# Patient Record
Sex: Female | Born: 1985 | Race: White | Hispanic: No | Marital: Married | Smoking: Current every day smoker
Health system: Southern US, Community
[De-identification: ages and names within clinical notes are randomized; demographics above are authoritative.]

## PROBLEM LIST (undated history)

## (undated) DIAGNOSIS — A749 Chlamydial infection, unspecified: Secondary | ICD-10-CM

## (undated) DIAGNOSIS — Q796 Ehlers-Danlos syndrome, unspecified: Secondary | ICD-10-CM

## (undated) DIAGNOSIS — R87612 Low grade squamous intraepithelial lesion on cytologic smear of cervix (LGSIL): Secondary | ICD-10-CM

## (undated) DIAGNOSIS — I7090 Unspecified atherosclerosis: Secondary | ICD-10-CM

## (undated) DIAGNOSIS — O039 Complete or unspecified spontaneous abortion without complication: Secondary | ICD-10-CM

## (undated) DIAGNOSIS — F419 Anxiety disorder, unspecified: Secondary | ICD-10-CM

## (undated) DIAGNOSIS — Z6791 Unspecified blood type, Rh negative: Secondary | ICD-10-CM

## (undated) DIAGNOSIS — O99119 Other diseases of the blood and blood-forming organs and certain disorders involving the immune mechanism complicating pregnancy, unspecified trimester: Secondary | ICD-10-CM

## (undated) DIAGNOSIS — T8859XA Other complications of anesthesia, initial encounter: Secondary | ICD-10-CM

## (undated) DIAGNOSIS — I639 Cerebral infarction, unspecified: Secondary | ICD-10-CM

## (undated) DIAGNOSIS — Z8673 Personal history of transient ischemic attack (TIA), and cerebral infarction without residual deficits: Secondary | ICD-10-CM

## (undated) DIAGNOSIS — O26899 Other specified pregnancy related conditions, unspecified trimester: Secondary | ICD-10-CM

## (undated) DIAGNOSIS — D6859 Other primary thrombophilia: Secondary | ICD-10-CM

## (undated) DIAGNOSIS — T4145XA Adverse effect of unspecified anesthetic, initial encounter: Secondary | ICD-10-CM

## (undated) DIAGNOSIS — D682 Hereditary deficiency of other clotting factors: Secondary | ICD-10-CM

## (undated) HISTORY — DX: Other specified pregnancy related conditions, unspecified trimester: O26.899

## (undated) HISTORY — DX: Cerebral infarction, unspecified: I63.9

## (undated) HISTORY — PX: ANKLE SURGERY: SHX546

## (undated) HISTORY — DX: Ehlers-Danlos syndrome, unspecified: Q79.60

## (undated) HISTORY — PX: BREAST ENHANCEMENT SURGERY: SHX7

## (undated) HISTORY — DX: Other primary thrombophilia: D68.59

## (undated) HISTORY — DX: Other diseases of the blood and blood-forming organs and certain disorders involving the immune mechanism complicating pregnancy, unspecified trimester: O99.119

## (undated) HISTORY — DX: Low grade squamous intraepithelial lesion on cytologic smear of cervix (LGSIL): R87.612

## (undated) HISTORY — PX: TONSILLECTOMY: SUR1361

## (undated) HISTORY — DX: Personal history of transient ischemic attack (TIA), and cerebral infarction without residual deficits: Z86.73

## (undated) HISTORY — DX: Unspecified atherosclerosis: I70.90

## (undated) HISTORY — PX: DILATION AND CURETTAGE OF UTERUS: SHX78

## (undated) HISTORY — DX: Hereditary deficiency of other clotting factors: D68.2

## (undated) HISTORY — DX: Unspecified blood type, rh negative: Z67.91

## (undated) HISTORY — DX: Anxiety disorder, unspecified: F41.9

## (undated) HISTORY — PX: SHOULDER SURGERY: SHX246

## (undated) HISTORY — DX: Complete or unspecified spontaneous abortion without complication: O03.9

## (undated) HISTORY — DX: Chlamydial infection, unspecified: A74.9

---

## 2004-06-04 ENCOUNTER — Emergency Department: Payer: Self-pay | Admitting: Emergency Medicine

## 2005-03-08 ENCOUNTER — Emergency Department (HOSPITAL_COMMUNITY): Admission: EM | Admit: 2005-03-08 | Discharge: 2005-03-08 | Payer: Self-pay | Admitting: Emergency Medicine

## 2005-04-01 ENCOUNTER — Emergency Department (HOSPITAL_COMMUNITY): Admission: EM | Admit: 2005-04-01 | Discharge: 2005-04-01 | Payer: Self-pay | Admitting: Emergency Medicine

## 2005-04-29 ENCOUNTER — Emergency Department (HOSPITAL_COMMUNITY): Admission: EM | Admit: 2005-04-29 | Discharge: 2005-04-29 | Payer: Self-pay | Admitting: Emergency Medicine

## 2005-05-01 ENCOUNTER — Encounter (INDEPENDENT_AMBULATORY_CARE_PROVIDER_SITE_OTHER): Payer: Self-pay | Admitting: *Deleted

## 2005-05-01 ENCOUNTER — Ambulatory Visit (HOSPITAL_COMMUNITY): Admission: RE | Admit: 2005-05-01 | Discharge: 2005-05-01 | Payer: Self-pay | Admitting: Obstetrics and Gynecology

## 2006-05-14 ENCOUNTER — Emergency Department (HOSPITAL_COMMUNITY): Admission: EM | Admit: 2006-05-14 | Discharge: 2006-05-14 | Payer: Self-pay | Admitting: Emergency Medicine

## 2006-05-16 ENCOUNTER — Inpatient Hospital Stay (HOSPITAL_COMMUNITY): Admission: AD | Admit: 2006-05-16 | Discharge: 2006-05-16 | Payer: Self-pay | Admitting: Obstetrics and Gynecology

## 2007-07-07 DIAGNOSIS — Z8673 Personal history of transient ischemic attack (TIA), and cerebral infarction without residual deficits: Secondary | ICD-10-CM

## 2007-07-07 DIAGNOSIS — I639 Cerebral infarction, unspecified: Secondary | ICD-10-CM

## 2007-07-07 HISTORY — DX: Cerebral infarction, unspecified: I63.9

## 2007-07-07 HISTORY — DX: Personal history of transient ischemic attack (TIA), and cerebral infarction without residual deficits: Z86.73

## 2007-08-20 ENCOUNTER — Emergency Department (HOSPITAL_COMMUNITY): Admission: EM | Admit: 2007-08-20 | Discharge: 2007-08-20 | Payer: Self-pay | Admitting: Emergency Medicine

## 2007-10-09 ENCOUNTER — Inpatient Hospital Stay (HOSPITAL_COMMUNITY): Admission: AD | Admit: 2007-10-09 | Discharge: 2007-10-09 | Payer: Self-pay | Admitting: Obstetrics & Gynecology

## 2007-11-14 ENCOUNTER — Emergency Department: Payer: Self-pay | Admitting: Emergency Medicine

## 2007-11-24 IMAGING — US US OB TRANSVAGINAL MODIFY
1 series · 14 of 28 positions shown · non-contrast
Comparison: none

CLINICAL DATA: 19-year-old female with positive pregnancy test.  Pelvic pain and spotting.
OBSTETRICAL ULTRASOUND <14 WKS AND TRANSVAGINAL OB US:
TECHNIQUE: Both transabdominal and transvaginal ultrasound examinations were performed for complete evaluation of the gestation as well as the maternal uterus, adnexal regions, and pelvic cul-de-sac.

[Series 1: ob · 0.32mm/px · 14 of 39 slices shown]
[im 2/39]
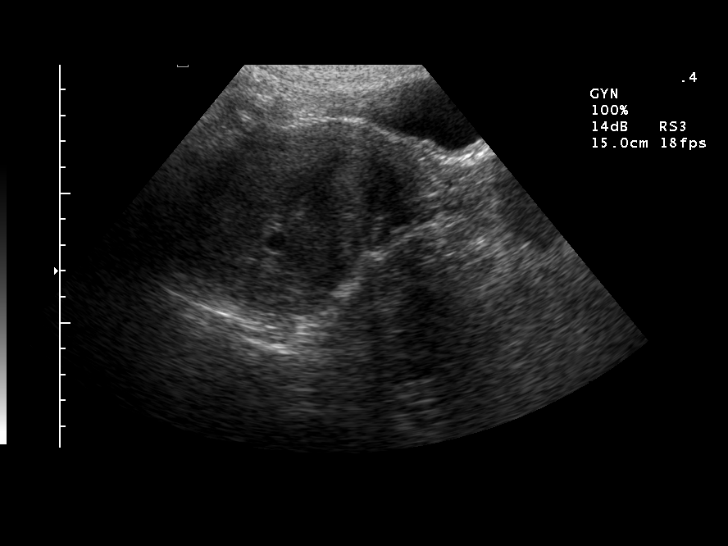
[im 5/39]
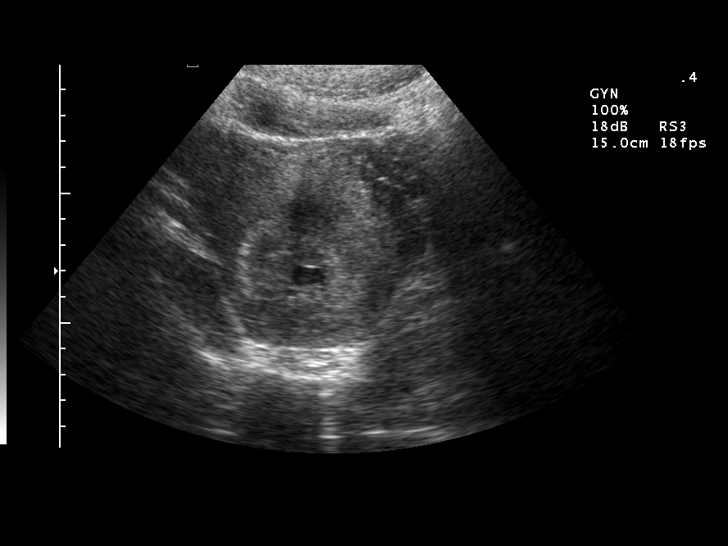
[im 8/39]
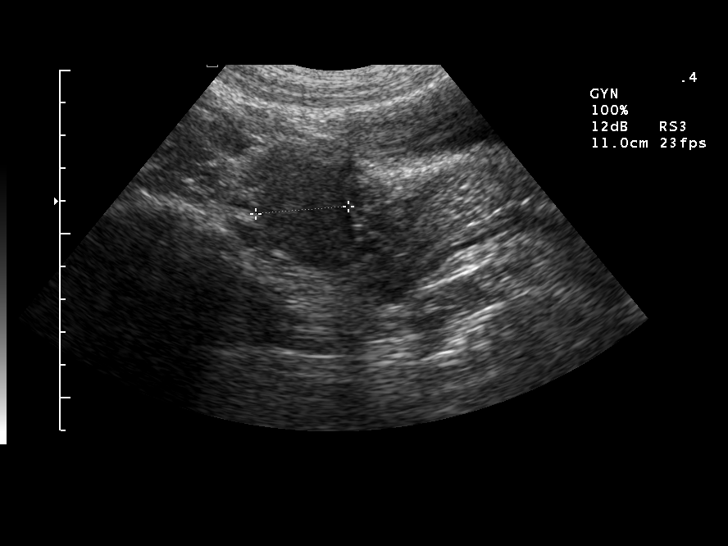
[im 10/39]
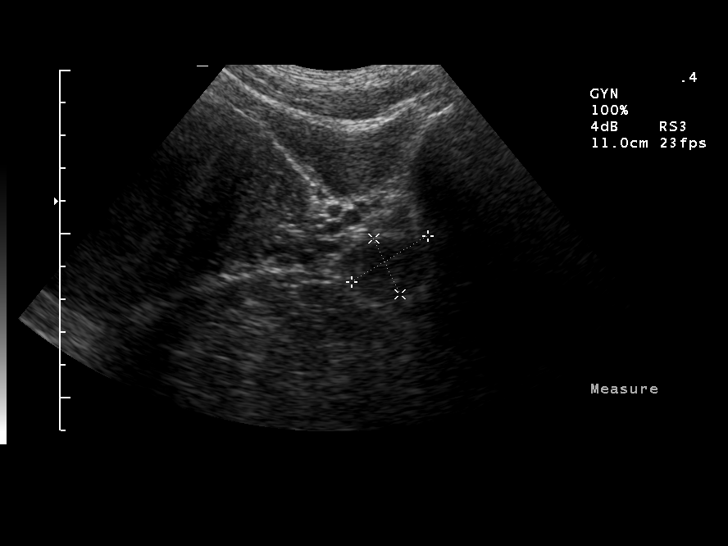
[im 13/39]
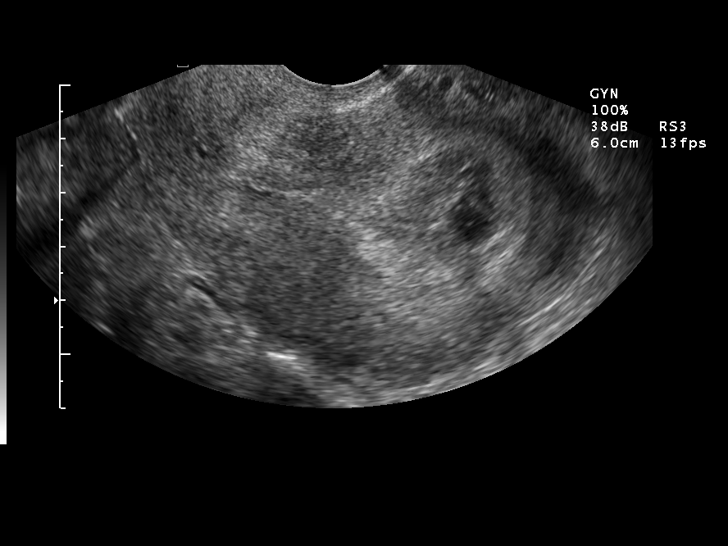
[im 16/39]
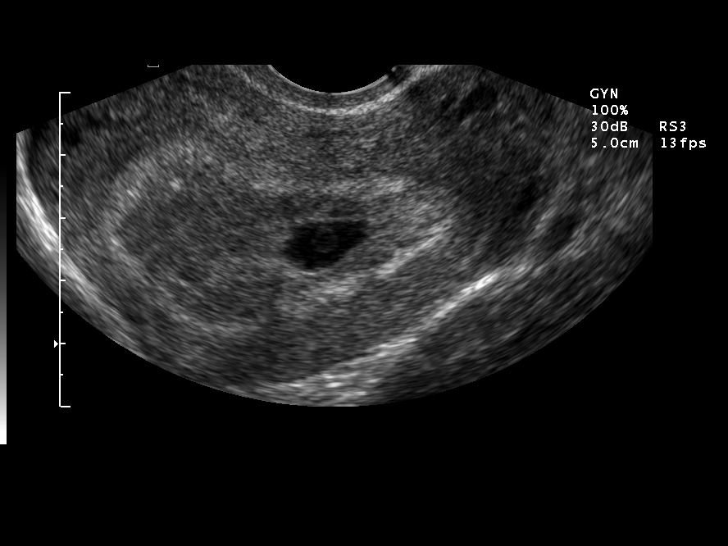
[im 19/39]
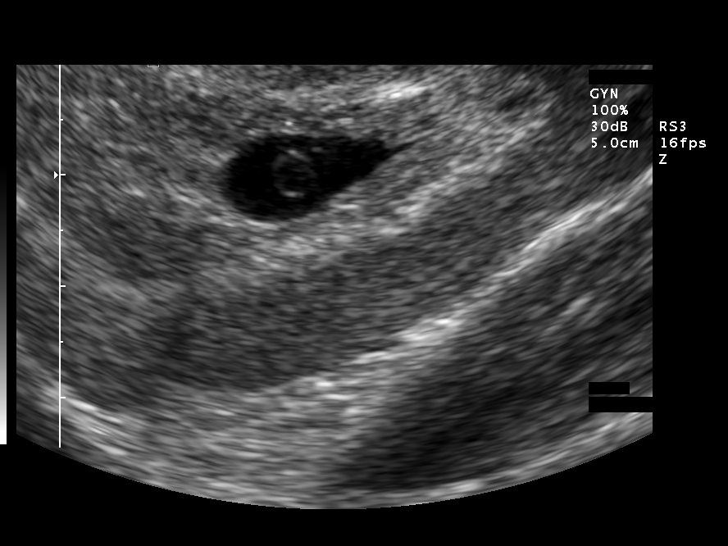
[im 22/39]
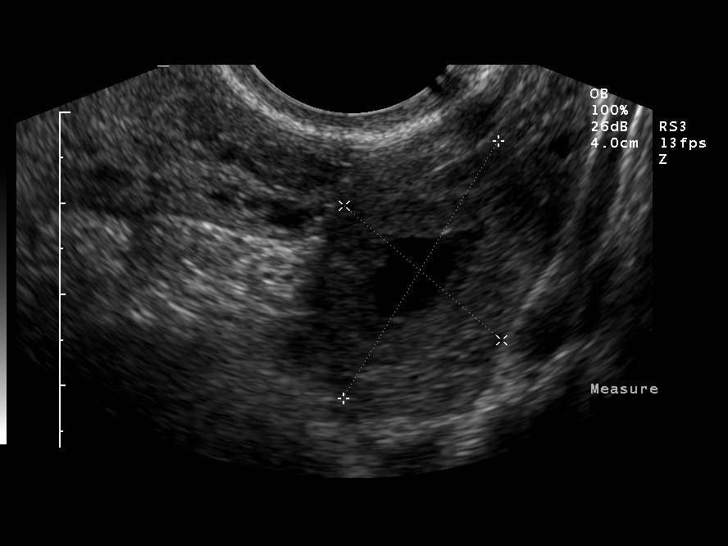
[im 24/39]
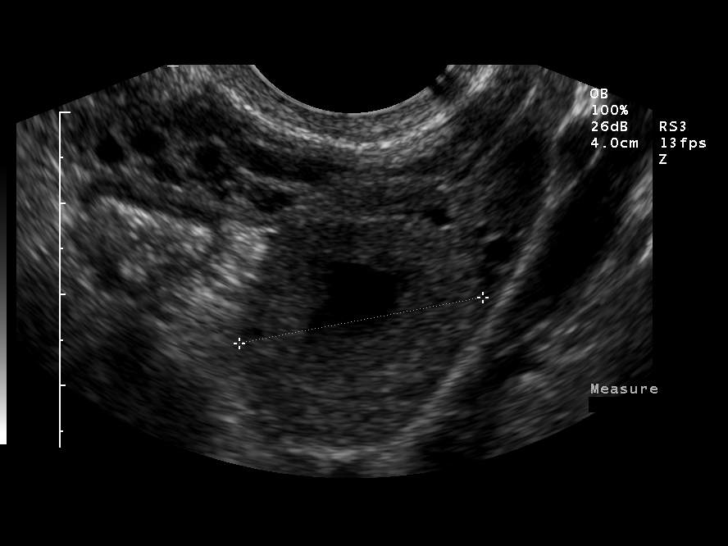
[im 27/39]
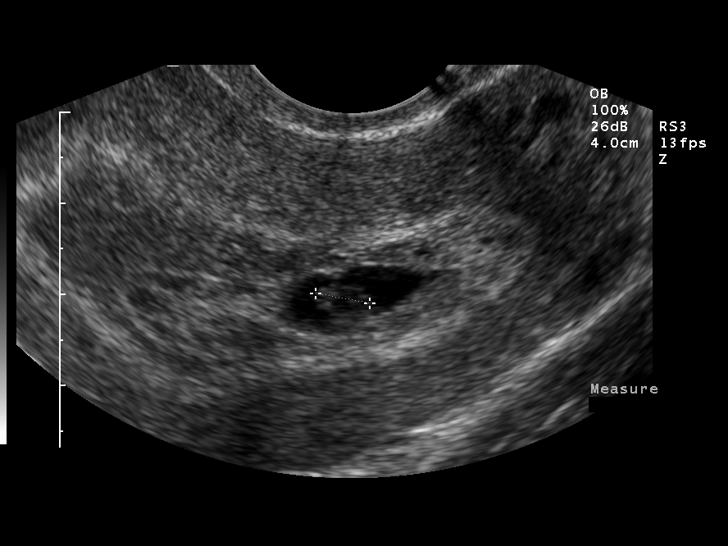
[im 30/39]
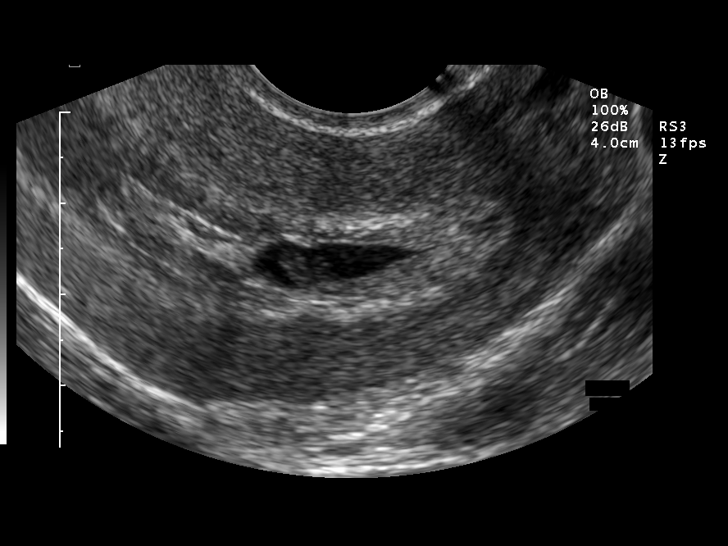
[im 33/39]
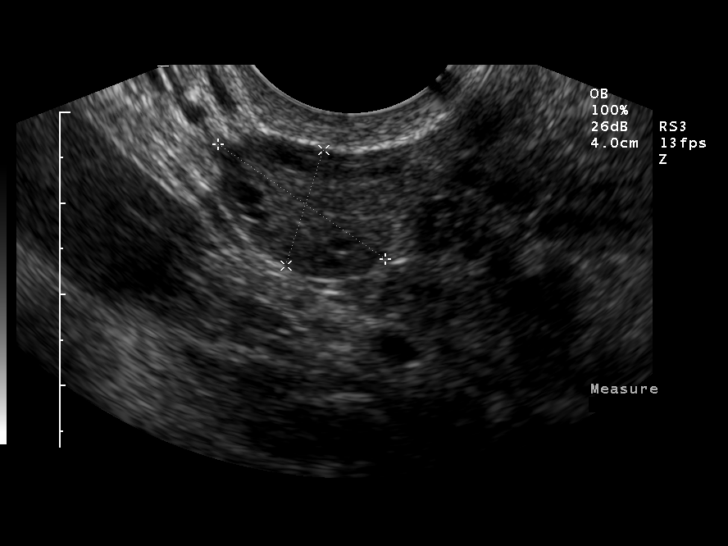
[im 36/39]
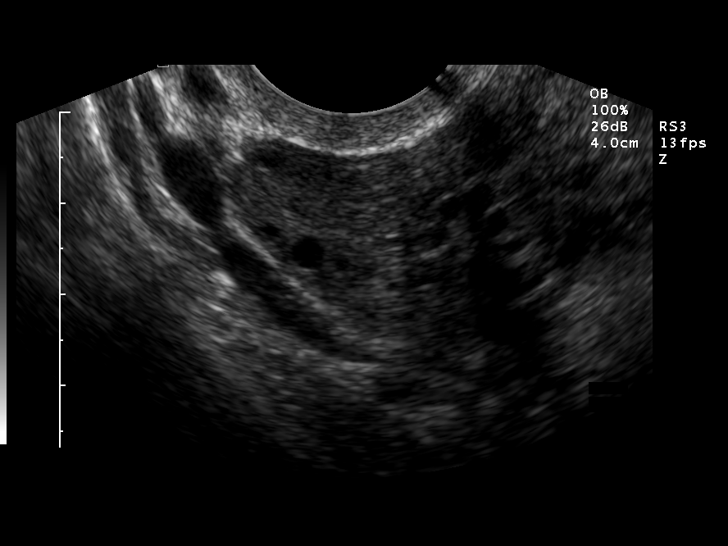
[im 39/39]
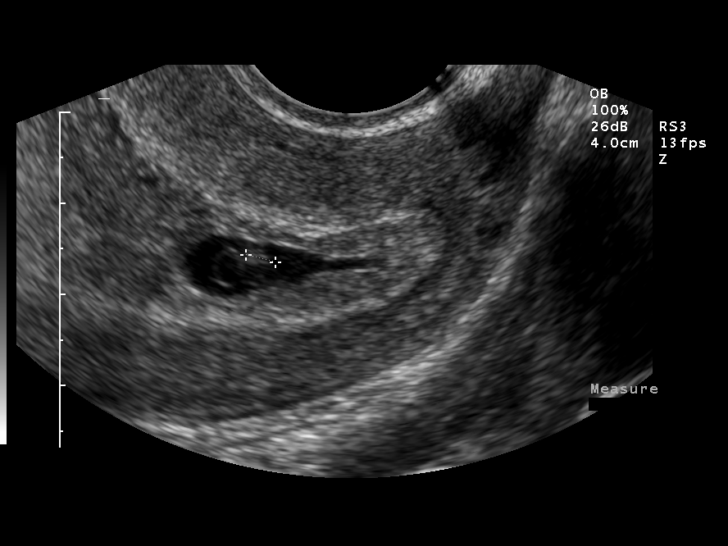

[14 of 28 positions shown; findings below may reference images not displayed]

FINDINGS: There is a single intrauterine gestational sac with yolk sac and fetal pole.  The gestational sac is slightly elongated.  Crown-rump length measures 3.3 mm, corresponding to a gestational age of 6 weeks 0 days.  Cardiac activity is not identified at this time.  A small subchorionic hemorrhage is noted.  Ovaries bilaterally are within normal limits.  No free fluid identified.
IMPRESSION: 1.  Single intrauterine gestation with estimated gestational age of 6 weeks 0 days.  Slightly irregular gestational sac and no fetal cardiac activity identified.  This still may represent a normal early pregnancy, but follow-up is recommended.
2.   Small subchorionic hemorrhage.

## 2008-08-04 ENCOUNTER — Ambulatory Visit: Payer: Self-pay | Admitting: Family Medicine

## 2008-08-04 DIAGNOSIS — I6350 Cerebral infarction due to unspecified occlusion or stenosis of unspecified cerebral artery: Secondary | ICD-10-CM | POA: Insufficient documentation

## 2008-08-04 DIAGNOSIS — D682 Hereditary deficiency of other clotting factors: Secondary | ICD-10-CM

## 2008-08-04 DIAGNOSIS — B019 Varicella without complication: Secondary | ICD-10-CM | POA: Insufficient documentation

## 2008-08-04 DIAGNOSIS — D6859 Other primary thrombophilia: Secondary | ICD-10-CM

## 2008-08-04 DIAGNOSIS — I808 Phlebitis and thrombophlebitis of other sites: Secondary | ICD-10-CM | POA: Insufficient documentation

## 2008-08-04 DIAGNOSIS — R519 Headache, unspecified: Secondary | ICD-10-CM | POA: Insufficient documentation

## 2008-08-04 DIAGNOSIS — R51 Headache: Secondary | ICD-10-CM

## 2008-08-26 ENCOUNTER — Encounter: Payer: Self-pay | Admitting: Family Medicine

## 2008-09-29 ENCOUNTER — Ambulatory Visit: Payer: Self-pay | Admitting: Family Medicine

## 2008-09-29 LAB — CONVERTED CEMR LAB
Basophils Absolute: 0 10*3/uL (ref 0.0–0.1)
Eosinophils Absolute: 0.2 10*3/uL (ref 0.0–0.7)
Hemoglobin: 14.2 g/dL (ref 12.0–15.0)
Monocytes Absolute: 0.7 10*3/uL (ref 0.1–1.0)
Monocytes Relative: 10 % (ref 3–12)
Neutrophils Relative %: 55 % (ref 43–77)
Platelets: 201 10*3/uL (ref 150–400)
RBC: 4.65 M/uL (ref 3.87–5.11)
RDW: 12.6 % (ref 11.5–15.5)
Rh Type: NEGATIVE
WBC: 6.5 10*3/uL (ref 4.0–10.5)
hCG, Beta Chain, Quant, S: 157.1 milliintl units/mL

## 2008-10-01 ENCOUNTER — Inpatient Hospital Stay (HOSPITAL_COMMUNITY): Admission: AD | Admit: 2008-10-01 | Discharge: 2008-10-01 | Payer: Self-pay | Admitting: Obstetrics & Gynecology

## 2008-10-03 ENCOUNTER — Ambulatory Visit (HOSPITAL_COMMUNITY): Admission: AD | Admit: 2008-10-03 | Discharge: 2008-10-03 | Payer: Self-pay | Admitting: Obstetrics & Gynecology

## 2008-10-04 ENCOUNTER — Ambulatory Visit: Payer: Self-pay | Admitting: Family Medicine

## 2008-10-12 ENCOUNTER — Ambulatory Visit: Payer: Self-pay | Admitting: Obstetrics & Gynecology

## 2008-10-12 LAB — CONVERTED CEMR LAB: hCG, Beta Chain, Quant, S: <2 milliintl units/mL

## 2008-10-19 ENCOUNTER — Ambulatory Visit: Payer: Self-pay | Admitting: Family Medicine

## 2008-10-19 ENCOUNTER — Encounter: Payer: Self-pay | Admitting: Family Medicine

## 2008-11-03 ENCOUNTER — Emergency Department: Payer: Self-pay | Admitting: Emergency Medicine

## 2008-11-18 ENCOUNTER — Encounter: Payer: Self-pay | Admitting: Obstetrics & Gynecology

## 2008-11-18 LAB — CONVERTED CEMR LAB: hCG, Beta Chain, Quant, S: 727.7 milliintl units/mL

## 2008-11-21 ENCOUNTER — Inpatient Hospital Stay (HOSPITAL_COMMUNITY): Admission: AD | Admit: 2008-11-21 | Discharge: 2008-11-21 | Payer: Self-pay | Admitting: Obstetrics and Gynecology

## 2008-11-22 ENCOUNTER — Ambulatory Visit: Payer: Self-pay | Admitting: Obstetrics and Gynecology

## 2008-11-22 LAB — CONVERTED CEMR LAB: hCG, Beta Chain, Quant, S: 282 milliintl units/mL

## 2008-11-23 ENCOUNTER — Ambulatory Visit: Payer: Self-pay | Admitting: Obstetrics & Gynecology

## 2008-12-15 ENCOUNTER — Encounter: Payer: Self-pay | Admitting: Family Medicine

## 2008-12-15 ENCOUNTER — Ambulatory Visit: Payer: Self-pay | Admitting: Obstetrics & Gynecology

## 2008-12-15 LAB — CONVERTED CEMR LAB: hCG, Beta Chain, Quant, S: <2 milliintl units/mL

## 2008-12-27 ENCOUNTER — Encounter: Payer: Self-pay | Admitting: Obstetrics and Gynecology

## 2008-12-27 LAB — CONVERTED CEMR LAB: hCG, Beta Chain, Quant, S: 69.2 milliintl units/mL

## 2009-01-03 ENCOUNTER — Encounter: Payer: Self-pay | Admitting: Family Medicine

## 2009-01-03 LAB — CONVERTED CEMR LAB: hCG, Beta Chain, Quant, S: 1846.9 milliintl units/mL

## 2009-01-05 ENCOUNTER — Encounter: Payer: Self-pay | Admitting: Obstetrics & Gynecology

## 2009-01-05 ENCOUNTER — Ambulatory Visit: Payer: Self-pay | Admitting: Obstetrics and Gynecology

## 2009-01-13 ENCOUNTER — Ambulatory Visit: Payer: Self-pay | Admitting: Family Medicine

## 2009-01-13 LAB — CONVERTED CEMR LAB
Basophils Absolute: 0 10*3/uL (ref 0.0–0.1)
Basophils Relative: 0 % (ref 0–1)
Chlamydia, Swab/Urine, PCR: NEGATIVE
GC Probe Amp, Urine: NEGATIVE
HCT: 41.5 % (ref 36.0–46.0)
Hemoglobin: 14.3 g/dL (ref 12.0–15.0)
Hepatitis B Surface Ag: NEGATIVE
MCHC: 34.5 g/dL (ref 30.0–36.0)
Platelets: 195 10*3/uL (ref 150–400)
RBC: 4.67 M/uL (ref 3.87–5.11)
RDW: 12.1 % (ref 11.5–15.5)

## 2009-01-17 ENCOUNTER — Ambulatory Visit: Payer: Self-pay | Admitting: Family Medicine

## 2009-01-17 LAB — CONVERTED CEMR LAB: hCG, Beta Chain, Quant, S: 30206 milliintl units/mL

## 2009-01-18 ENCOUNTER — Encounter: Payer: Self-pay | Admitting: Family Medicine

## 2009-01-18 LAB — CONVERTED CEMR LAB: hCG, Beta Chain, Quant, S: 31646.8 milliintl units/mL

## 2009-01-19 ENCOUNTER — Encounter: Payer: Self-pay | Admitting: Family Medicine

## 2009-01-20 ENCOUNTER — Ambulatory Visit (HOSPITAL_COMMUNITY): Admission: RE | Admit: 2009-01-20 | Discharge: 2009-01-20 | Payer: Self-pay | Admitting: Family Medicine

## 2009-01-24 ENCOUNTER — Ambulatory Visit: Payer: Self-pay | Admitting: Obstetrics and Gynecology

## 2009-01-24 ENCOUNTER — Encounter: Payer: Self-pay | Admitting: Obstetrics & Gynecology

## 2009-01-26 ENCOUNTER — Ambulatory Visit: Payer: Self-pay | Admitting: Obstetrics & Gynecology

## 2009-01-27 ENCOUNTER — Ambulatory Visit (HOSPITAL_COMMUNITY): Admission: RE | Admit: 2009-01-27 | Discharge: 2009-01-27 | Payer: Self-pay | Admitting: Obstetrics & Gynecology

## 2009-01-27 ENCOUNTER — Ambulatory Visit: Payer: Self-pay | Admitting: Obstetrics & Gynecology

## 2009-02-22 ENCOUNTER — Ambulatory Visit: Payer: Self-pay | Admitting: Obstetrics & Gynecology

## 2009-06-13 ENCOUNTER — Ambulatory Visit: Payer: Self-pay | Admitting: Obstetrics and Gynecology

## 2010-03-06 ENCOUNTER — Ambulatory Visit: Admit: 2010-03-06 | Payer: Self-pay | Admitting: Obstetrics and Gynecology

## 2010-03-07 ENCOUNTER — Encounter: Payer: Self-pay | Admitting: Family Medicine

## 2010-03-10 ENCOUNTER — Institutional Professional Consult (permissible substitution): Payer: Self-pay

## 2010-03-10 ENCOUNTER — Encounter: Payer: Self-pay | Admitting: Family Medicine

## 2010-03-10 DIAGNOSIS — Z3480 Encounter for supervision of other normal pregnancy, unspecified trimester: Secondary | ICD-10-CM

## 2010-03-10 LAB — CONVERTED CEMR LAB
Antibody Screen: NEGATIVE
Basophils Absolute: 0 10*3/uL (ref 0.0–0.1)
Eosinophils Absolute: 0.2 10*3/uL (ref 0.0–0.7)
Eosinophils Relative: 2 % (ref 0–5)
Hemoglobin: 15.2 g/dL — ABNORMAL HIGH (ref 12.0–15.0)
Hepatitis B Surface Ag: NEGATIVE
Lymphs Abs: 2.3 10*3/uL (ref 0.7–4.0)
MCV: 88.9 fL (ref 78.0–100.0)
Monocytes Absolute: 0.6 10*3/uL (ref 0.1–1.0)
Neutro Abs: 7 10*3/uL (ref 1.7–7.7)
Rubella: 36.4 intl units/mL — ABNORMAL HIGH
WBC: 10.2 10*3/uL (ref 4.0–10.5)

## 2010-03-15 ENCOUNTER — Encounter: Payer: Self-pay | Admitting: Obstetrics & Gynecology

## 2010-03-15 DIAGNOSIS — Z1272 Encounter for screening for malignant neoplasm of vagina: Secondary | ICD-10-CM

## 2010-03-15 DIAGNOSIS — Z3480 Encounter for supervision of other normal pregnancy, unspecified trimester: Secondary | ICD-10-CM

## 2010-03-22 DIAGNOSIS — Z3480 Encounter for supervision of other normal pregnancy, unspecified trimester: Secondary | ICD-10-CM

## 2010-03-24 ENCOUNTER — Other Ambulatory Visit: Payer: PRIVATE HEALTH INSURANCE

## 2010-03-29 ENCOUNTER — Encounter: Payer: PRIVATE HEALTH INSURANCE | Admitting: Obstetrics & Gynecology

## 2010-04-01 ENCOUNTER — Inpatient Hospital Stay (HOSPITAL_COMMUNITY): Payer: PRIVATE HEALTH INSURANCE

## 2010-04-01 ENCOUNTER — Inpatient Hospital Stay (HOSPITAL_COMMUNITY)
Admission: AD | Admit: 2010-04-01 | Discharge: 2010-04-01 | Disposition: A | Discharge status: Home / Self Care | Payer: PRIVATE HEALTH INSURANCE | Source: Ambulatory Visit | Attending: Obstetrics & Gynecology | Admitting: Obstetrics & Gynecology

## 2010-04-01 ENCOUNTER — Other Ambulatory Visit: Payer: Self-pay | Admitting: Obstetrics & Gynecology

## 2010-04-01 DIAGNOSIS — O039 Complete or unspecified spontaneous abortion without complication: Secondary | ICD-10-CM

## 2010-04-01 LAB — DIFFERENTIAL: Neutrophils Relative %: 74 % (ref 43–77)

## 2010-04-01 LAB — CBC
HCT: 42.4 % (ref 36.0–46.0)
MCH: 31 pg (ref 26.0–34.0)
RBC: 4.77 MIL/uL (ref 3.87–5.11)
WBC: 10.7 10*3/uL — ABNORMAL HIGH (ref 4.0–10.5)

## 2010-04-02 LAB — RHOGAM INJECTION

## 2010-04-06 DEATH — deceased

## 2010-04-11 ENCOUNTER — Ambulatory Visit: Payer: PRIVATE HEALTH INSURANCE | Admitting: Obstetrics and Gynecology

## 2010-04-11 DIAGNOSIS — N949 Unspecified condition associated with female genital organs and menstrual cycle: Secondary | ICD-10-CM

## 2010-04-28 NOTE — Assessment & Plan Note (Signed)
Regina Butler, Regina [name]:  [id number]  MEDICAL RECORD NO.:  [id number]           PATIENT TYPE:  LOCATION:  [facility] at [facility]           FACILITY:  PHYSICIAN:  Tinnie Gens, MD        DATE OF BIRTH:  04/13/1985  DATE OF SERVICE:                                 CLINIC NOTE  CHIEF COMPLAINT:  Follow-up SAB.  HISTORY OF PRESENT ILLNESS:  The patient is a 25 year old multigravid patient, unclear exactly how far how many miscarriages she has had.  She has had one full-term delivery, but seems like 10 or 11 intervening miscarriages.  She is just recently on April 01, 2010, miscarried her last pregnancy which has gotten to approximately 9-10 weeks and she was on Prometrium to see if that would help her stop miscarrying.  She has known protein S deficiency and questionably Ehlers-Danlos, although I do not think this is a certainly not a collagen deficiency problem at present.  The patient is very tearful today.  She has had multiple ultrasounds with a known fetus and good fetal heart rate and she passed the fetus in milieu and actually saw and is having trouble getting that out of her mind, having trouble sleeping, having panic attacks recurrently.  I have reviewed records today from her hematologist, Dr. Haynes Bast, who says she probably has protein S deficiency and he does not think she has factor V Leiden deficiency or protein C deficiency or antithrombin III.  He recommended her to resume her Lovenox from 4-5 days after her bleeding stopped.  She thinks she will resume this on Thursday.  Additionally, he had referred her to [facility] where she went and she had a very large workup with a lot of blood test for recurrent SABs including chromosome studies on both she and her husband. She is interested today in insurance coverage for Copper T IUD.  PHYSICAL EXAMINATION:  VITAL SIGNS:  Her vitals are shown  in chart. GENERAL:  A well-nourished female in no acute  distress. ABDOMEN:  Soft, nontender, nondistended. GU:  Normal external female genitalia.  BUS normal. Vagina is pink and rugated.  Cervix is closed.  The uterus is small and well involuted approximately 6 weeks' size, nontender.  IMPRESSION:  Recurrent abortion, completed.  PLAN: 1. We will try to get records from [facility].  Check insurance     coverage for Copper IUD, in the meantime, she will use condoms as     needed for birth control she should. 2. Resume her Lovenox in the next few days. 3. For her panic attacks and just until she gets grief counseling     which she has an appointment scheduled next week for.  We will give     her a prescription of Xanax 0.25 one to two p.o. t.i.d. as needed     for panic attacks.         ______________________________ Tinnie Gens, MD   TP/MEDQ  D:  04/11/2010  T:  04/12/2010  Job:  [id number]

## 2010-05-08 LAB — CBC
Hemoglobin: 15 g/dL (ref 12.0–15.0)
RBC: 4.77 MIL/uL (ref 3.87–5.11)
RDW: 12.1 % (ref 11.5–15.5)
WBC: 8.5 10*3/uL (ref 4.0–10.5)

## 2010-05-11 LAB — RH IMMUNE GLOBULIN WORKUP (NOT WOMEN'S HOSP)
ABO/RH(D): A NEG
DAT, IgG: NEGATIVE

## 2010-05-11 LAB — CBC
HCT: 37.9 % (ref 36.0–46.0)
Hemoglobin: 12.8 g/dL (ref 12.0–15.0)
MCHC: 33.9 g/dL (ref 30.0–36.0)
MCV: 93.3 fL (ref 78.0–100.0)
RBC: 4.07 MIL/uL (ref 3.87–5.11)

## 2010-05-13 LAB — CBC
HCT: 44.7 % (ref 36.0–46.0)
MCV: 93.1 fL (ref 78.0–100.0)
Platelets: 220 10*3/uL (ref 150–400)
RDW: 12.2 % (ref 11.5–15.5)

## 2010-05-13 LAB — WET PREP, GENITAL: Yeast Wet Prep HPF POC: NONE SEEN

## 2010-05-13 LAB — RH IMMUNE GLOBULIN WORKUP (NOT WOMEN'S HOSP): Antibody Screen: NEGATIVE

## 2010-05-13 LAB — POCT PREGNANCY, URINE: Preg Test, Ur: POSITIVE

## 2010-05-13 LAB — GC/CHLAMYDIA PROBE AMP, GENITAL
Chlamydia, DNA Probe: NEGATIVE
GC Probe Amp, Genital: NEGATIVE

## 2010-06-20 NOTE — Assessment & Plan Note (Signed)
NAMEHANLEY, RISPOLI NO.:  [id number]   MEDICAL RECORD NO.:  [id number]          PATIENT TYPE:  POB   LOCATION:  [facility] at [facility]         FACILITY:  [facility]   PHYSICIAN:  Scheryl Darter, MD       DATE OF BIRTH:  08/24/1985   DATE OF SERVICE:                                  CLINIC NOTE   The patient is postoperative from a suction dilatation curettage that  was performed on January 27, 2009.  Pathology report from the specimen  showed products of conception.  She says she passed a clot a few days  after the procedure.  She has no complaints now.  She has a history of  protein S deficiency, protein C deficiency, antithrombin III deficiency,  and factor V Leiden.  She is on Lovenox 60 mg subcutaneously twice a  day.  She feels as try to conceive.  She has had 1 live birth.   PHYSICAL EXAMINATION:  GENERAL:  No acute distress, and her affect seems  normal.  PELVIC:  Deferred today.   Recommend that she use condoms for about 2 weeks, and she should notify  us if she conceives.      Scheryl Darter, MD     JA/MEDQ  D:  02/22/2009  T:  02/23/2009  Job:  [id number]

## 2010-06-20 NOTE — Assessment & Plan Note (Signed)
Regina Butler, Regina Butler             ACCOUNT NO.:  [id number]   MEDICAL RECORD NO.:  [id number]          PATIENT TYPE:  POB   LOCATION:  [facility] at [facility]         FACILITY:  [facility]   PHYSICIAN:  Tinnie Gens, MD        DATE OF BIRTH:  1985-12-04   DATE OF SERVICE:                                  CLINIC NOTE   CHIEF COMPLAINT:  Followup an yearly.   HISTORY OF PRESENT ILLNESS:  The patient is a 25 year old gravida 6,  para 1-0-5-1 who has history of multiple SABs, factor V Leiden mutation  and protein S deficiency.  The patient is on lifelong anticoagulation  with Lovenox and had a recent miscarriage early this fall and abated and  it did finally go down to less than 2.  The patient does have history of  irregular cycles, and in fact has only 1 cycle in every 4 months.  The  patient thought her multiple miscarriages were related to her above  diagnoses and thought Lovenox would prevent miscarriage this time but  that did not happen.   PAST MEDICAL HISTORY:  She has had previously factor V Leiden mutation  and protein S deficiency.  She had a history of TIA that led to facial  droop and paralysis in June 2009, which led to workup and diagnosis.   PAST SURGICAL HISTORY:  Tonsillectomy, D and E, breast lift, and breast  augmentation   MEDICATIONS:  She is on Lovenox 60 mg subcutaneous b.i.d.   ALLERGIES:  PENICILLIN and AMOXICILLIN   OBSTETRICAL HISTORY:  She is a G6, P1.  She had one vaginal delivery of  a 5 pound 9 ounce female, and she has had 5 miscarriages all for 11  weeks, and she had D and C for 2 of these.   GYN HISTORY:  No significant history of abnormal Pap smear.   FAMILY HISTORY:  No history of factor V Leiden mutation.  No history of  GYN cancers.   PHYSICAL EXAMINATION:  VITAL SIGNS:  Her vitals are as noted in the  chart.  GENERAL:  She is a well-developed, well-nourished female in no acute  distress.  HEENT:  Normocephalic, atraumatic.  Sclerae anicteric.  NECK:  Supple.  Normal thyroid.  LUNGS:  Clear bilaterally.  CV:  Regular rate and rhythm without rubs, gallops, or murmurs.  ABDOMEN:  Soft, nontender, nondistended.  EXTREMITIES:  No cyanosis, clubbing, or edema.  BREASTS:  Symmetric with everted nipples.  No masses.  No  supraclavicular or axillary adenopathy.  She has had breast lift and  breast augmentation done and the scars are healed well.  GU:  Normal external female genitalia.  BUS normal.  Vagina is pink and  rugated.  Cervix is parous without lesion.  Uterus is small, anteverted.  No adnexal mass or tenderness.   IMPRESSION:  1. Yearly exam.  2. Factor V Leiden mutation and protein S deficiency, requiring      anticoagulation.  3. History of multiple spontaneous abortions.   PLAN:  As she has one reason for multiple SABs, we could rule out  septated uterus and we will obtain HSG.  Additionally, could consider  TSH and  diabetes testing in the future.  Discussed how difficult it may  be for her to achieve pregnancy when she is only having an egg produced  once every 4 months.  She is clearly not a candidate for estrogen  containing compounds.  I am unclear about Clomid at this time.   PLAN:  1. Pap smear today.  2. Check HSG.  3. Follow up as needed.  Continue Lovenox.           ______________________________  Tinnie Gens, MD     TP/MEDQ  D:  10/19/2008  T:  10/20/2008  Job:  [id number]

## 2010-06-20 NOTE — Assessment & Plan Note (Signed)
Regina Butler, DERENZO             ACCOUNT NO.:  [id number]   MEDICAL RECORD NO.:  [id number]          PATIENT TYPE:  POB   LOCATION:  [facility] at [facility]         FACILITY:  [facility]   PHYSICIAN:  Tinnie Gens, MD        DATE OF BIRTH:  [birth year]   DATE OF SERVICE:                                  CLINIC NOTE   CHIEF COMPLAINT:  Follow up SAB.   HISTORY OF PRESENT ILLNESS:  The patient is a 25 year old gravida 6,  para 1-0-5-1 who previously had 4 SAB.  She has been following up with  Dr. Sylvester Harder' office for these and then eventually had a stroke  and got a workup for that and turned out to have factor V Leiden  mutation.  She has heterozygous protein-S deficiency as well.  The  patient is on lifelong anticoagulation at 60 mg twice daily of Lovenox.  The patient achieved pregnancy at this time and then had a spontaneous  miscarriage around 5-6 weeks.  The patient has very irregular periods,  in all in fact only has a cycle once every 4 months and had been trying  to get pregnant since December and had finally achieved pregnancy which  she miscarried.  The patient reports that her mood is okay.  She is  still sad at times.  On exam today, her vitals are as noted in the  chart.  Uterus is small, anteverted.  No adnexal mass or tenderness.   IMPRESSION:  1. Status post spontaneous abortion.  2. Factor V Leiden mutation and protein C deficiency, on lifelong      anticoagulation.   PLAN:  Follow up in 2 weeks for yearly exam and discussion of further  workup.           ______________________________  Tinnie Gens, MD     TP/MEDQ  D:  10/19/2008  T:  10/20/2008  Job:  [id number]

## 2010-06-20 NOTE — Assessment & Plan Note (Signed)
[name], [name]:  [id number]   MEDICAL RECORD NO.:  [id number]           PATIENT TYPE:   LOCATION:  [facility] at [facility]           FACILITY:   PHYSICIAN:  Argentina Donovan, MD        DATE OF BIRTH:  1985/06/24   DATE OF SERVICE:  01/24/2009                                  CLINIC NOTE   HISTORY:  The patient is a 25 year old white female gravida 10, para 1-0-  9-1, who recently had 3 SABs with recently diagnosed pregnancy and came  in here and was seen to have a fetal heart a couple weeks ago and then 1  week ago begin to have an episode of heavy bleeding.  She went into the  [facility], had an ultrasound.  The ultrasound by the time she had got the  bleeding and it stopped completely but still looked as if there was  debris and side of the uterus.  No fetal nor yolk-sac could be  visualized and she was thought probably have an incomplete abortion,  although she has not bled since that date which was on December 16.  On  that day, she had a beta HCG level of 37,715 which was up from 31,000  two days prior to that.  She came in today for a diaphragm fitting.  On  the other hand, I do not think that this problem is properly settled and  so we are going to have her draw another beta today, have her return  tomorrow for a decision on what to do.  She has had so many, she is has  not really anxious to go through spontaneous abortion again and would  rather have a D&C and certainly if the beta continues to rise, she may  need that.  She said many spontaneous abortions.  She has factor V  Leiden mutation and protein CS deficiencies.  She has been on  anticoagulants throughout her life, although she has had one successful  pregnancy after 3 prior miscarriages.  Her living child is 19 years old  at this time.  The plan is to repeat the beta HCG and counsel the  patient whether to await a spontaneous miscarriage or schedule her for  [facility] which you think she would prefer.  Once  this is settled, then she  can come back and be measured for a diaphragm much as her choice for  contraception at this point.           ______________________________  Argentina Donovan, MD     PR/MEDQ  D:  01/24/2009  T:  01/25/2009  Job:  [id number]

## 2010-06-23 NOTE — H&P (Signed)
Regina Butler, Regina Butler             ACCOUNT NO.:  [id number]   MEDICAL RECORD NO.:  [id number]          PATIENT TYPE:  AMB   LOCATION:  [facility]                           FACILITY:  [facility]   PHYSICIAN:  James A. Ashley Royalty, M.D.DATE OF BIRTH:  09/14/1985   DATE OF ADMISSION:  05/01/2005  DATE OF DISCHARGE:                                HISTORY & PHYSICAL   PATIENT PROFILE:  This is a 25 year old gravida 3, para 1, Ab1.   CHIEF COMPLAINT:  Miscarriage.   HISTORY OF PRESENT ILLNESS:  The patient presented to [facility] on April 29, 2005 complaining of vaginal bleeding.  She states  her last menstrual period is uncertain due to recent Depo-Provera use.  She  delivered vaginally, May 2005.  She had spontaneous abortion, July 2006.  On  April 29, 2005, she experienced the sudden onset of vaginal bleeding and  soaked her thighs down to her knee prior to going to the emergency  department.  However, upon presentation at the emergency department, she was  found to have minimal bleeding.  An hCG was performed and revealed a value  of 4053 MIU/mL.  Ultrasound had been done, April 01, 2005, at [facility]  Emergency Department revealing an intrauterine pregnancy at 6 weeks'  gestation.  Repeat ultrasound, April 29, 2005, revealed no live intrauterine  gestation.  The expected gestational age by the first ultrasound was  approximately 10-1/2 weeks' gestation.   MEDICATIONS:  Vitamins.   PAST MEDICAL HISTORY:   MEDICAL:  Negative.   SURGICAL:  Tonsillectomy and adenoidectomy.   ALLERGIES:  PENICILLIN, AMOXICILLIN, NAPROSYN.   FAMILY HISTORY:  Noncontributory.   SOCIAL HISTORY:  The patient smokes 1/2 pack of cigarettes per day.   REVIEW OF SYSTEMS:  Noncontributory.   PHYSICAL EXAMINATION:  GENERAL:  Well-developed, well-nourished, pleasant  female in no acute distress.  VITAL SIGNS:  Afebrile, vital signs stable.  CHEST:  Lungs are clear.  CARDIAC:  Regular rate and  rhythm.  ABDOMEN:  Soft and nontender without masses or organomegaly.  PELVIC:  External genitalia within normal limits.  Vagina and cervix are  without gross lesions.  There is a minimal amount of blood in the vault.  The cervical os is closed.  There is not tissue.  Bimanual examination  reveals the uterus to be approximately 10 weeks' size.  I can appreciate no  adnexal masses.   IMPRESSION:  Missed abortion.   PLAN:  Suction, dilatation and curettage.  Risks, benefits, complications  and alternatives were fully discussed with the patient; she states she  understands and accepts.  Questions were invited and answered.   COMMENT:  Please note, a portion of the evaluation for this document was  performed in the outpatient setting.      James A. Ashley Royalty, M.D.  Electronically Signed     JAM/MEDQ  D:  05/01/2005  T:  05/01/2005  Job:  [id number]

## 2010-06-23 NOTE — Op Note (Signed)
Regina Butler, Regina Butler             ACCOUNT NO.:  [id number]   MEDICAL RECORD NO.:  [id number]          PATIENT TYPE:  AMB   LOCATION:  [facility]                           FACILITY:  [facility]   PHYSICIAN:  James A. Ashley Royalty, M.D.DATE OF BIRTH:  1985/03/06   DATE OF PROCEDURE:  05/01/2005  DATE OF DISCHARGE:                                 OPERATIVE REPORT   PREOPERATIVE DIAGNOSIS:  Missed abortion at approximately [redacted] weeks  gestation.   POSTOPERATIVE DIAGNOSIS:  Missed abortion at approximately [redacted] weeks  gestation.  Pathology pending.   PROCEDURE:  Suction dilatation and curettage.   SURGEON:  Rudy Jew. Ashley Royalty, M.D.   ANESTHESIA:  Monitored anesthesia care with 1% Xylocaine paracervical block  (20 mL).   FINDINGS:  Uterus sounded to approximately 10 cm and noted to be  retroverted.  There was a minimal to moderate amount of apparent products of  conception obtained.   ESTIMATED BLOOD LOSS:  50 mL.   COMPLICATIONS:  None.   PACKS DRAINS:  None.   PROCEDURE:  The patient was taken to the operating room, placed in the  dorsosupine position.  IV sedation was administered.  She was placed in the  lithotomy position and prepped and draped in usual manner for vaginal  surgery.  Posterior weighted retractor was placed per vagina.  The anterior  lip of the cervix was grasped with a single-toothed tenaculum.  The cervix  was infiltrated with approximately 20 mL of 1% Xylocaine in order to create  a paracervical block.  The cervical os was noted to be somewhat dilated.  The uterus was gently sounded to 10 cm and noted to be retroverted.  The  cervix was verified as already dilated to approximately 35-French using the  [facility] dilator.  A 10 mm suction curette was then introduced into the uterine  cavity.  Suction was applied.  A small to moderate amount of apparent  products of conception was delivered through the tubing.  After several  passed with suction curette, no additional tissue was  obtained.  At this  point the procedure was terminated.  Vaginal instruments removed.  Hemostasis noted.   The patient was then taken to the recovery room in excellent condition.      James A. Ashley Royalty, M.D.  Electronically Signed    JAM/MEDQ  D:  05/01/2005  T:  05/02/2005  Job:  [id number]

## 2010-07-06 ENCOUNTER — Emergency Department: Payer: Self-pay | Admitting: Emergency Medicine

## 2010-10-05 ENCOUNTER — Other Ambulatory Visit (INDEPENDENT_AMBULATORY_CARE_PROVIDER_SITE_OTHER): Payer: PRIVATE HEALTH INSURANCE

## 2010-10-05 DIAGNOSIS — N912 Amenorrhea, unspecified: Secondary | ICD-10-CM

## 2010-10-05 NOTE — Progress Notes (Signed)
Patient has had increased fatigue and nausea/vomitting.  With her cycles being so irregular she wishes to have a HCG quant drawn.

## 2010-11-03 LAB — BASIC METABOLIC PANEL
CO2: 23
Chloride: 106
Glucose, Bld: 87
Potassium: 3.4 — ABNORMAL LOW
Sodium: 138

## 2010-11-03 LAB — CBC
HCT: 42.5
Hemoglobin: 14.6
MCHC: 34.4
RDW: 12.7

## 2010-11-08 LAB — CBC
Hemoglobin: 14.1
MCHC: 33.6
MCV: 93.9
RBC: 4.47
WBC: 10.3

## 2010-11-08 LAB — ABO/RH: ABO/RH(D): A NEG

## 2010-11-08 LAB — RH IMMUNE GLOBULIN WORKUP (NOT WOMEN'S HOSP): Antibody Screen: NEGATIVE

## 2011-02-06 NOTE — L&D Delivery Note (Signed)
Delivery Note At 11:58 AM, after one push, a viable female was delivered via  (Presentation:LOA ;  ).  APGAR: 9/9, ; weight .  pendingPlacenta status: , .  Cord:  with the following complications: none  Anesthesia: Epidural  Episiotomy: None Lacerations: None Suture Repair: n/a Est. Blood Loss (mL): 100  Mom to postpartum.  Baby to nursery-stable.  CRESENZO-DISHMAN,Regina Butler 12/02/2011, 12:10 PM

## 2011-04-02 ENCOUNTER — Other Ambulatory Visit (INDEPENDENT_AMBULATORY_CARE_PROVIDER_SITE_OTHER): Payer: PRIVATE HEALTH INSURANCE

## 2011-04-02 DIAGNOSIS — N912 Amenorrhea, unspecified: Secondary | ICD-10-CM

## 2011-04-02 NOTE — Progress Notes (Signed)
Patient comes in today for blood work to confirm her positive home pregnancy test.  She is having heartburn symptoms but otherwise doing well.  Blood drawn today for HCG quant.  Bedside ultrasound shows no visible intrauterine sac at this point.  We will await blood results to see if she is far enough along to be able to see something.

## 2011-04-02 NOTE — Progress Notes (Signed)
Addended by: Barbara Cower on: 04/02/2011 12:02 PM   Modules accepted: Orders

## 2011-04-03 LAB — HCG, QUANTITATIVE, PREGNANCY: hCG, Beta Chain, Quant, S: 187.6 m[IU]/mL

## 2011-04-11 ENCOUNTER — Other Ambulatory Visit (INDEPENDENT_AMBULATORY_CARE_PROVIDER_SITE_OTHER): Payer: PRIVATE HEALTH INSURANCE | Admitting: *Deleted

## 2011-04-11 DIAGNOSIS — Z3480 Encounter for supervision of other normal pregnancy, unspecified trimester: Secondary | ICD-10-CM

## 2011-04-11 DIAGNOSIS — O0990 Supervision of high risk pregnancy, unspecified, unspecified trimester: Secondary | ICD-10-CM

## 2011-04-11 NOTE — Progress Notes (Signed)
Patient comes today for HCG quant.  She is doing well with just a small back ache.  Bloodwork is drawn and bedside ultrasound shows 5wk 4day sac with no visible fetal pole.  She will follow up next week to recheck her ultrasound and do full ob work up as long as everything is continuing to progress.

## 2011-04-18 ENCOUNTER — Other Ambulatory Visit (INDEPENDENT_AMBULATORY_CARE_PROVIDER_SITE_OTHER): Payer: PRIVATE HEALTH INSURANCE

## 2011-04-18 DIAGNOSIS — O0990 Supervision of high risk pregnancy, unspecified, unspecified trimester: Secondary | ICD-10-CM

## 2011-04-18 NOTE — Progress Notes (Signed)
Patient is here today to have blood work done and a bedside ultrasound to confirm fetal viability.  There is a positive fetal heart rate on ultrasound.  Patient will return to clinic next week to continue to confirm growth due to her history of multiple miscarriages.  She is having a lot of nausea and fatigue and is reassured by this.

## 2011-05-02 ENCOUNTER — Ambulatory Visit (INDEPENDENT_AMBULATORY_CARE_PROVIDER_SITE_OTHER): Payer: PRIVATE HEALTH INSURANCE | Admitting: *Deleted

## 2011-05-02 DIAGNOSIS — Z3480 Encounter for supervision of other normal pregnancy, unspecified trimester: Secondary | ICD-10-CM

## 2011-05-02 NOTE — Progress Notes (Signed)
Patient was seen today with positive fetal heart and fetal pole measuring 8wks and 3 days, which matches her lmp and is consistent with her previous ultrasounds.  She is still very cautious to get excited about this pregnancy due to her history, which is understandable.  She will come next week for her new ob appointment.  She is continuing her Lovenox twice daily and has heard there is a new pill call Xarelto that she is interested in if  It is ok.

## 2011-05-03 LAB — OBSTETRIC PANEL
Antibody Screen: NEGATIVE
Eosinophils Absolute: 0.2 10*3/uL (ref 0.0–0.7)
Eosinophils Relative: 2 % (ref 0–5)
HCT: 40.2 % (ref 36.0–46.0)
Lymphocytes Relative: 19 % (ref 12–46)
Lymphs Abs: 2.3 10*3/uL (ref 0.7–4.0)
MCH: 30.3 pg (ref 26.0–34.0)
MCV: 88.2 fL (ref 78.0–100.0)
Monocytes Absolute: 0.8 10*3/uL (ref 0.1–1.0)
Platelets: 205 10*3/uL (ref 150–400)
RBC: 4.56 MIL/uL (ref 3.87–5.11)
Rh Type: NEGATIVE
Rubella: 31.4 IU/mL — ABNORMAL HIGH

## 2011-05-03 LAB — HIV ANTIBODY (ROUTINE TESTING W REFLEX): HIV: NONREACTIVE

## 2011-05-09 ENCOUNTER — Ambulatory Visit (INDEPENDENT_AMBULATORY_CARE_PROVIDER_SITE_OTHER): Payer: PRIVATE HEALTH INSURANCE | Admitting: Obstetrics & Gynecology

## 2011-05-09 ENCOUNTER — Encounter: Payer: Self-pay | Admitting: Obstetrics & Gynecology

## 2011-05-09 ENCOUNTER — Other Ambulatory Visit: Payer: Self-pay | Admitting: Obstetrics & Gynecology

## 2011-05-09 VITALS — BP 119/73 | Wt 140.0 lb

## 2011-05-09 DIAGNOSIS — D6859 Other primary thrombophilia: Secondary | ICD-10-CM

## 2011-05-09 DIAGNOSIS — Z3682 Encounter for antenatal screening for nuchal translucency: Secondary | ICD-10-CM

## 2011-05-09 DIAGNOSIS — D689 Coagulation defect, unspecified: Secondary | ICD-10-CM

## 2011-05-09 DIAGNOSIS — O0990 Supervision of high risk pregnancy, unspecified, unspecified trimester: Secondary | ICD-10-CM

## 2011-05-09 DIAGNOSIS — Z124 Encounter for screening for malignant neoplasm of cervix: Secondary | ICD-10-CM

## 2011-05-09 DIAGNOSIS — Z113 Encounter for screening for infections with a predominantly sexual mode of transmission: Secondary | ICD-10-CM

## 2011-05-09 NOTE — Progress Notes (Signed)
Subjective:    Regina Butler is a G12 P1-0-10-1 [redacted]w[redacted]d being seen today for her first obstetrical visit.  Her obstetrical history is significant for history of multiple SABs, factor V Leiden mutation, Protein C and protein S deficiency. She had a history of TIA that led to facial droop and paralysis in June 2009.  The patient is on lifelong anticoagulation with Lovenox 60 mg subcutaneous b.i.d .  Pregnancy history fully reviewed.   Patient reports no complaints. She is trying hard not to be hopeful about this pregnancy given her history.  Filed Vitals:   05/09/11 1011  BP: 119/73  Weight: 140 lb (63.504 kg)    HISTORY: OB History    Grav Para Term Preterm Abortions TAB SAB Ect Mult Living   12 1 1  0 10 0 10 0 0 1     # Outc Date GA Lbr Len/2nd Wgt Sex Del Anes PTL Lv   1 TRM 5/05 [redacted]w[redacted]d  5lb9oz(2.523kg) F SVD EPI     2 SAB            3 SAB            4 SAB            5 SAB            6 SAB            7 SAB            8 SAB            9 SAB            10 SAB            11 SAB            12 CUR              PAST MEDICAL HISTORY:  Diagnosis Date  . Thrombophilia   . Protein S deficiency complicating pregnancy   . Protein C deficiency complicating pregnancy   . Factor V Leiden     heterozygote  . Class III atherosclerotic vascular disease   . Personal history of TIA (transient ischemic attack) 07/2007    facial droop/paralysis  . Rh negative status during pregnancy   . Ehler's-Danlos syndrome   . SAB (spontaneous abortion)     10 previous sab   PAST SURGICAL HISTORY: D & E x 5, Tonsillectomy, breast lift, and breast  augmentation   ALLERGIES: PENICILLIN and AMOXICILLIN   OBSTETRICAL HISTORY: She had one vaginal delivery of a 5 pound 9 ounce female, and she has had 10 miscarriages all before 11 weeks, and she had D&E for 5 of these.   GYN HISTORY: No significant history of abnormal Pap smear or STIs   FAMILY HISTORY: No history of factor V Leiden mutation. No  history of  GYN cancers.   Exam   Uterine Size: size equals dates  Pelvic Exam:    Perineum: No Hemorrhoids   Vulva: normal   Vagina:  normal mucosa, normal discharge   pH: Not done   Cervix: multiparous appearance, no bleeding following Pap and no lesions   Adnexa: normal adnexa and no mass, fullness, tenderness   Bony Pelvis: average  System: Breast:  normal appearance, no masses or tenderness, implants in place   Skin: normal coloration and turgor, no rashes   Neurologic: normal   Extremities: normal strength, tone, and muscle mass   HEENT PERRLA   Mouth/Teeth mucous membranes moist,  pharynx normal without lesions and dental hygiene good   Neck supple and no masses   Cardiovascular: regular rate and rhythm   Respiratory:  appears well, vitals normal, no respiratory distress, acyanotic, normal RR   Abdomen: soft, non-tender; bowel sounds normal; no masses,  no organomegaly   Urinary: urethral meatus normal   Assessment:    Pregnancy: G12P1-0-10-1 Patient Active Problem List  Diagnoses  . CHICKENPOX  . FACTOR V LEIDEN  . PRIMARY HYPERCOAGULABLE STATE  . CEREBROVASCULAR ACCIDENT  . PHLEBITIS AND THROMBOPHLEBITIS OF OTHER SITE  . HEADACHE  . Pregnancy, supervision for, high-risk  . Thrombophilia complicating pregnancy, antepartum      Plan:   Continue Lovenox, prenatal vitamins. Problem list reviewed and updated. Genetic Screening discussed First Screen: ordered.  MFM consult also ordered given high-risk pregnancy. Ultrasound discussed; fetal survey will be ordered around 18-20 weeks Follow up in 4 weeks.

## 2011-05-09 NOTE — Patient Instructions (Signed)
Pregnancy - First Trimester  During sexual intercourse, millions of sperm go into the vagina. Only 1 sperm will penetrate and fertilize the female egg while it is in the Fallopian tube. One week later, the fertilized egg implants into the wall of the uterus. An embryo begins to develop into a baby. At 6 to 8 weeks, the eyes and face are formed and the heartbeat can be seen on ultrasound. At the end of 12 weeks (first trimester), all the baby's organs are formed. Now that you are pregnant, you will want to do everything you can to have a healthy baby. Two of the most important things are to get good prenatal care and follow your caregiver's instructions. Prenatal care is all the medical care you receive before the baby's birth. It is given to prevent, find, and treat problems during the pregnancy and childbirth.  PRENATAL EXAMS   During prenatal visits, your weight, blood pressure and urine are checked. This is done to make sure you are healthy and progressing normally during the pregnancy.   A pregnant woman should gain 25 to 35 pounds during the pregnancy. However, if you are over weight or underweight, your caregiver will advise you regarding your weight.   Your caregiver will ask and answer questions for you.   Blood work, cervical cultures, other necessary tests and a Pap test are done during your prenatal exams. These tests are done to check on your health and the probable health of your baby. Tests are strongly recommended and done for HIV with your permission. This is the virus that causes AIDS. These tests are done because medications can be given to help prevent your baby from being born with this infection should you have been infected without knowing it. Blood work is also used to find out your blood type, previous infections and follow your blood levels (hemoglobin).   Low hemoglobin (anemia) is common during pregnancy. Iron and vitamins are given to help prevent this. Later in the pregnancy, blood  tests for diabetes will be done along with any other tests if any problems develop. You may need tests to make sure you and the baby are doing well.   You may need other tests to make sure you and the baby are doing well.  CHANGES DURING THE FIRST TRIMESTER (THE FIRST 3 MONTHS OF PREGNANCY)  Your body goes through many changes during pregnancy. They vary from person to person. Talk to your caregiver about changes you notice and are concerned about. Changes can include:   Your menstrual period stops.   The egg and sperm carry the genes that determine what you look like. Genes from you and your partner are forming a baby. The female genes determine whether the baby is a boy or a girl.   Your body increases in girth and you may feel bloated.   Feeling sick to your stomach (nauseous) and throwing up (vomiting). If the vomiting is uncontrollable, call your caregiver.   Your breasts will begin to enlarge and become tender.   Your nipples may stick out more and become darker.   The need to urinate more. Painful urination may mean you have a bladder infection.   Tiring easily.   Loss of appetite.   Cravings for certain kinds of food.   At first, you may gain or lose a couple of pounds.   You may have changes in your emotions from day to day (excited to be pregnant or concerned something may go wrong with   the pregnancy and baby).   You may have more vivid and strange dreams.  HOME CARE INSTRUCTIONS    It is very important to avoid all smoking, alcohol and un-prescribed drugs during your pregnancy. These affect the formation and growth of the baby. Avoid chemicals while pregnant to ensure the delivery of a healthy infant.   Start your prenatal visits by the 12th week of pregnancy. They are usually scheduled monthly at first, then more often in the last 2 months before delivery. Keep your caregiver's appointments. Follow your caregiver's instructions regarding medication use, blood and lab tests, exercise, and  diet.   During pregnancy, you are providing food for you and your baby. Eat regular, well-balanced meals. Choose foods such as meat, fish, milk and other low fat dairy products, vegetables, fruits, and whole-grain breads and cereals. Your caregiver will tell you of the ideal weight gain.   You can help morning sickness by keeping soda crackers at the bedside. Eat a couple before arising in the morning. You may want to use the crackers without salt on them.   Eating 4 to 5 small meals rather than 3 large meals a day also may help the nausea and vomiting.   Drinking liquids between meals instead of during meals also seems to help nausea and vomiting.   A physical sexual relationship may be continued throughout pregnancy if there are no other problems. Problems may be early (premature) leaking of amniotic fluid from the membranes, vaginal bleeding, or belly (abdominal) pain.   Exercise regularly if there are no restrictions. Check with your caregiver or physical therapist if you are unsure of the safety of some of your exercises. Greater weight gain will occur in the last 2 trimesters of pregnancy. Exercising will help:   Control your weight.   Keep you in shape.   Prepare you for labor and delivery.   Help you lose your pregnancy weight after you deliver your baby.   Wear a good support or jogging bra for breast tenderness during pregnancy. This may help if worn during sleep too.   Ask when prenatal classes are available. Begin classes when they are offered.   Do not use hot tubs, steam rooms or saunas.   Wear your seat belt when driving. This protects you and your baby if you are in an accident.   Avoid raw meat, uncooked cheese, cat litter boxes and soil used by cats throughout the pregnancy. These carry germs that can cause birth defects in the baby.   The first trimester is a good time to visit your dentist for your dental health. Getting your teeth cleaned is OK. Use a softer toothbrush and brush  gently during pregnancy.   Ask for help if you have financial, counseling or nutritional needs during pregnancy. Your caregiver will be able to offer counseling for these needs as well as refer you for other special needs.   Do not take any medications or herbs unless told by your caregiver.   Inform your caregiver if there is any mental or physical domestic violence.   Make a list of emergency phone numbers of family, friends, hospital, and police and fire departments.   Write down your questions. Take them to your prenatal visit.   Do not douche.   Do not cross your legs.   If you have to stand for long periods of time, rotate you feet or take small steps in a circle.   You may have more vaginal secretions that may   require a sanitary pad. Do not use tampons or scented sanitary pads.  MEDICATIONS AND DRUG USE IN PREGNANCY   Take prenatal vitamins as directed. The vitamin should contain 1 milligram of folic acid. Keep all vitamins out of reach of children. Only a couple vitamins or tablets containing iron may be fatal to a baby or young child when ingested.   Avoid use of all medications, including herbs, over-the-counter medications, not prescribed or suggested by your caregiver. Only take over-the-counter or prescription medicines for pain, discomfort, or fever as directed by your caregiver. Do not use aspirin, ibuprofen, or naproxen unless directed by your caregiver.   Let your caregiver also know about herbs you may be using.   Alcohol is related to a number of birth defects. This includes fetal alcohol syndrome. All alcohol, in any form, should be avoided completely. Smoking will cause low birth rate and premature babies.   Street or illegal drugs are very harmful to the baby. They are absolutely forbidden. A baby born to an addicted mother will be addicted at birth. The baby will go through the same withdrawal an adult does.   Let your caregiver know about any medications that you have to take  and for what reason you take them.  MISCARRIAGE IS COMMON DURING PREGNANCY  A miscarriage does not mean you did something wrong. It is not a reason to worry about getting pregnant again. Your caregiver will help you with questions you may have. If you have a miscarriage, you may need minor surgery.  SEEK MEDICAL CARE IF:   You have any concerns or worries during your pregnancy. It is better to call with your questions if you feel they cannot wait, rather than worry about them.  SEEK IMMEDIATE MEDICAL CARE IF:    An unexplained oral temperature above 102 F (38.9 C) develops, or as your caregiver suggests.   You have leaking of fluid from the vagina (birth canal). If leaking membranes are suspected, take your temperature and inform your caregiver of this when you call.   There is vaginal spotting or bleeding. Notify your caregiver of the amount and how many pads are used.   You develop a bad smelling vaginal discharge with a change in the color.   You continue to feel sick to your stomach (nauseated) and have no relief from remedies suggested. You vomit blood or coffee ground-like materials.   You lose more than 2 pounds of weight in 1 week.   You gain more than 2 pounds of weight in 1 week and you notice swelling of your face, hands, feet, or legs.   You gain 5 pounds or more in 1 week (even if you do not have swelling of your hands, face, legs, or feet).   You get exposed to German measles and have never had them.   You are exposed to fifth disease or chickenpox.   You develop belly (abdominal) pain. Round ligament discomfort is a common non-cancerous (benign) cause of abdominal pain in pregnancy. Your caregiver still must evaluate this.   You develop headache, fever, diarrhea, pain with urination, or shortness of breath.   You fall or are in a car accident or have any kind of trauma.   There is mental or physical violence in your home.  Document Released: 01/16/2001 Document Revised: 01/11/2011  Document Reviewed: 07/20/2008  [employer] Patient Information 2012 [employer].

## 2011-05-22 ENCOUNTER — Other Ambulatory Visit (INDEPENDENT_AMBULATORY_CARE_PROVIDER_SITE_OTHER): Payer: PRIVATE HEALTH INSURANCE | Admitting: *Deleted

## 2011-05-22 DIAGNOSIS — D6859 Other primary thrombophilia: Secondary | ICD-10-CM

## 2011-05-22 DIAGNOSIS — O99119 Other diseases of the blood and blood-forming organs and certain disorders involving the immune mechanism complicating pregnancy, unspecified trimester: Secondary | ICD-10-CM

## 2011-05-22 DIAGNOSIS — D689 Coagulation defect, unspecified: Secondary | ICD-10-CM

## 2011-05-22 NOTE — Progress Notes (Signed)
Patient is here for fetal heart rate check.  Positive fetal heart rate on u/s baby is active.  Patient and her husband are reassured.  She will follow up next week for same.  She did schedule her first trimester screen for next week.

## 2011-05-28 ENCOUNTER — Ambulatory Visit (HOSPITAL_COMMUNITY)
Admission: RE | Admit: 2011-05-28 | Discharge: 2011-05-28 | Disposition: A | Discharge status: Home / Self Care | Payer: PRIVATE HEALTH INSURANCE | Source: Ambulatory Visit | Attending: Unknown Physician Specialty | Admitting: Unknown Physician Specialty

## 2011-05-28 ENCOUNTER — Other Ambulatory Visit: Payer: Self-pay

## 2011-05-28 ENCOUNTER — Ambulatory Visit (HOSPITAL_COMMUNITY)
Admission: RE | Admit: 2011-05-28 | Discharge: 2011-05-28 | Disposition: A | Discharge status: Home / Self Care | Payer: PRIVATE HEALTH INSURANCE | Source: Ambulatory Visit | Attending: Obstetrics & Gynecology | Admitting: Obstetrics & Gynecology

## 2011-05-28 ENCOUNTER — Encounter (HOSPITAL_COMMUNITY): Payer: Self-pay

## 2011-05-28 ENCOUNTER — Encounter: Payer: Self-pay | Admitting: Obstetrics & Gynecology

## 2011-05-28 VITALS — BP 120/78 | HR 95 | Wt 141.0 lb

## 2011-05-28 DIAGNOSIS — Z3689 Encounter for other specified antenatal screening: Secondary | ICD-10-CM | POA: Insufficient documentation

## 2011-05-28 DIAGNOSIS — O351XX0 Maternal care for (suspected) chromosomal abnormality in fetus, not applicable or unspecified: Secondary | ICD-10-CM | POA: Insufficient documentation

## 2011-05-28 DIAGNOSIS — O2620 Pregnancy care for patient with recurrent pregnancy loss, unspecified trimester: Secondary | ICD-10-CM | POA: Insufficient documentation

## 2011-05-28 DIAGNOSIS — Z3682 Encounter for antenatal screening for nuchal translucency: Secondary | ICD-10-CM

## 2011-05-28 DIAGNOSIS — O3510X0 Maternal care for (suspected) chromosomal abnormality in fetus, unspecified, not applicable or unspecified: Secondary | ICD-10-CM | POA: Insufficient documentation

## 2011-06-12 ENCOUNTER — Ambulatory Visit (INDEPENDENT_AMBULATORY_CARE_PROVIDER_SITE_OTHER): Payer: PRIVATE HEALTH INSURANCE | Admitting: Obstetrics & Gynecology

## 2011-06-12 VITALS — BP 113/76 | Wt 138.0 lb

## 2011-06-12 DIAGNOSIS — D689 Coagulation defect, unspecified: Secondary | ICD-10-CM

## 2011-06-12 DIAGNOSIS — O0990 Supervision of high risk pregnancy, unspecified, unspecified trimester: Secondary | ICD-10-CM

## 2011-06-12 DIAGNOSIS — D6859 Other primary thrombophilia: Secondary | ICD-10-CM

## 2011-06-12 DIAGNOSIS — D682 Hereditary deficiency of other clotting factors: Secondary | ICD-10-CM

## 2011-06-12 NOTE — Progress Notes (Signed)
Normal NT.  No other issues.  Anatomy scan in 4 weeks.  No other complaints or concerns.  Preterm labor precautions reviewed.

## 2011-06-12 NOTE — Patient Instructions (Signed)
Pregnancy - Second Trimester The second trimester of pregnancy (3 to 6 months) is a period of rapid growth for you and your baby. At the end of the sixth month, your baby is about 9 inches long and weighs 1 1/2 pounds. You will begin to feel the baby move between 18 and 20 weeks of the pregnancy. This is called quickening. Weight gain is faster. A clear fluid (colostrum) may leak out of your breasts. You may feel small contractions of the womb (uterus). This is known as false labor or Braxton-Hicks contractions. This is like a practice for labor when the baby is ready to be born. Usually, the problems with morning sickness have usually passed by the end of your first trimester. Some women develop small dark blotches (called cholasma, mask of pregnancy) on their face that usually goes away after the baby is born. Exposure to the sun makes the blotches worse. Acne may also develop in some pregnant women and pregnant women who have acne, may find that it goes away. PRENATAL EXAMS  Blood work may continue to be done during prenatal exams. These tests are done to check on your health and the probable health of your baby. Blood work is used to follow your blood levels (hemoglobin). Anemia (low hemoglobin) is common during pregnancy. Iron and vitamins are given to help prevent this. You will also be checked for diabetes between 24 and 28 weeks of the pregnancy. Some of the previous blood tests may be repeated.   The size of the uterus is measured during each visit. This is to make sure that the baby is continuing to grow properly according to the dates of the pregnancy.   Your blood pressure is checked every prenatal visit. This is to make sure you are not getting toxemia.   Your urine is checked to make sure you do not have an infection, diabetes or protein in the urine.   Your weight is checked often to make sure gains are happening at the suggested rate. This is to ensure that both you and your baby are  growing normally.   Sometimes, an ultrasound is performed to confirm the proper growth and development of the baby. This is a test which bounces harmless sound waves off the baby so your caregiver can more accurately determine due dates.  Sometimes, a specialized test is done on the amniotic fluid surrounding the baby. This test is called an amniocentesis. The amniotic fluid is obtained by sticking a needle into the belly (abdomen). This is done to check the chromosomes in instances where there is a concern about possible genetic problems with the baby. It is also sometimes done near the end of pregnancy if an early delivery is required. In this case, it is done to help make sure the baby's lungs are mature enough for the baby to live outside of the womb. CHANGES OCCURING IN THE SECOND TRIMESTER OF PREGNANCY Your body goes through many changes during pregnancy. They vary from person to person. Talk to your caregiver about changes you notice that you are concerned about.  During the second trimester, you will likely have an increase in your appetite. It is normal to have cravings for certain foods. This varies from person to person and pregnancy to pregnancy.   Your lower abdomen will begin to bulge.   You may have to urinate more often because the uterus and baby are pressing on your bladder. It is also common to get more bladder infections during pregnancy (  pain with urination). You can help this by drinking lots of fluids and emptying your bladder before and after intercourse.   You may begin to get stretch marks on your hips, abdomen, and breasts. These are normal changes in the body during pregnancy. There are no exercises or medications to take that prevent this change.   You may begin to develop swollen and bulging veins (varicose veins) in your legs. Wearing support hose, elevating your feet for 15 minutes, 3 to 4 times a day and limiting salt in your diet helps lessen the problem.    Heartburn may develop as the uterus grows and pushes up against the stomach. Antacids recommended by your caregiver helps with this problem. Also, eating smaller meals 4 to 5 times a day helps.   Constipation can be treated with a stool softener or adding bulk to your diet. Drinking lots of fluids, vegetables, fruits, and whole grains are helpful.   Exercising is also helpful. If you have been very active up until your pregnancy, most of these activities can be continued during your pregnancy. If you have been less active, it is helpful to start an exercise program such as walking.   Hemorrhoids (varicose veins in the rectum) may develop at the end of the second trimester. Warm sitz baths and hemorrhoid cream recommended by your caregiver helps hemorrhoid problems.   Backaches may develop during this time of your pregnancy. Avoid heavy lifting, wear low heal shoes and practice good posture to help with backache problems.   Some pregnant women develop tingling and numbness of their hand and fingers because of swelling and tightening of ligaments in the wrist (carpel tunnel syndrome). This goes away after the baby is born.   As your breasts enlarge, you may have to get a bigger bra. Get a comfortable, cotton, support bra. Do not get a nursing bra until the last month of the pregnancy if you will be nursing the baby.   You may get a dark line from your belly button to the pubic area called the linea nigra.   You may develop rosy cheeks because of increase blood flow to the face.   You may develop spider looking lines of the face, neck, arms and chest. These go away after the baby is born.  HOME CARE INSTRUCTIONS   It is extremely important to avoid all smoking, herbs, alcohol, and unprescribed drugs during your pregnancy. These chemicals affect the formation and growth of the baby. Avoid these chemicals throughout the pregnancy to ensure the delivery of a healthy infant.   Most of your home  care instructions are the same as suggested for the first trimester of your pregnancy. Keep your caregiver's appointments. Follow your caregiver's instructions regarding medication use, exercise and diet.   During pregnancy, you are providing food for you and your baby. Continue to eat regular, well-balanced meals. Choose foods such as meat, fish, milk and other low fat dairy products, vegetables, fruits, and whole-grain breads and cereals. Your caregiver will tell you of the ideal weight gain.   A physical sexual relationship may be continued up until near the end of pregnancy if there are no other problems. Problems could include early (premature) leaking of amniotic fluid from the membranes, vaginal bleeding, abdominal pain, or other medical or pregnancy problems.   Exercise regularly if there are no restrictions. Check with your caregiver if you are unsure of the safety of some of your exercises. The greatest weight gain will occur in the   last 2 trimesters of pregnancy. Exercise will help you:   Control your weight.   Get you in shape for labor and delivery.   Lose weight after you have the baby.   Wear a good support or jogging bra for breast tenderness during pregnancy. This may help if worn during sleep. Pads or tissues may be used in the bra if you are leaking colostrum.   Do not use hot tubs, steam rooms or saunas throughout the pregnancy.   Wear your seat belt at all times when driving. This protects you and your baby if you are in an accident.   Avoid raw meat, uncooked cheese, cat litter boxes and soil used by cats. These carry germs that can cause birth defects in the baby.   The second trimester is also a good time to visit your dentist for your dental health if this has not been done yet. Getting your teeth cleaned is OK. Use a soft toothbrush. Brush gently during pregnancy.   It is easier to loose urine during pregnancy. Tightening up and strengthening the pelvic muscles will  help with this problem. Practice stopping your urination while you are going to the bathroom. These are the same muscles you need to strengthen. It is also the muscles you would use as if you were trying to stop from passing gas. You can practice tightening these muscles up 10 times a set and repeating this about 3 times per day. Once you know what muscles to tighten up, do not perform these exercises during urination. It is more likely to contribute to an infection by backing up the urine.   Ask for help if you have financial, counseling or nutritional needs during pregnancy. Your caregiver will be able to offer counseling for these needs as well as refer you for other special needs.   Your skin may become oily. If so, wash your face with mild soap, use non-greasy moisturizer and oil or cream based makeup.  MEDICATIONS AND DRUG USE IN PREGNANCY  Take prenatal vitamins as directed. The vitamin should contain 1 milligram of folic acid. Keep all vitamins out of reach of children. Only a couple vitamins or tablets containing iron may be fatal to a baby or young child when ingested.   Avoid use of all medications, including herbs, over-the-counter medications, not prescribed or suggested by your caregiver. Only take over-the-counter or prescription medicines for pain, discomfort, or fever as directed by your caregiver. Do not use aspirin.   Let your caregiver also know about herbs you may be using.   Alcohol is related to a number of birth defects. This includes fetal alcohol syndrome. All alcohol, in any form, should be avoided completely. Smoking will cause low birth rate and premature babies.   Street or illegal drugs are very harmful to the baby. They are absolutely forbidden. A baby born to an addicted mother will be addicted at birth. The baby will go through the same withdrawal an adult does.  SEEK MEDICAL CARE IF:  You have any concerns or worries during your pregnancy. It is better to call with  your questions if you feel they cannot wait, rather than worry about them. SEEK IMMEDIATE MEDICAL CARE IF:   An unexplained oral temperature above 102 F (38.9 C) develops, or as your caregiver suggests.   You have leaking of fluid from the vagina (birth canal). If leaking membranes are suspected, take your temperature and tell your caregiver of this when you call.   There   is vaginal spotting, bleeding, or passing clots. Tell your caregiver of the amount and how many pads are used. Light spotting in pregnancy is common, especially following intercourse.   You develop a bad smelling vaginal discharge with a change in the color from clear to white.   You continue to feel sick to your stomach (nauseated) and have no relief from remedies suggested. You vomit blood or coffee ground-like materials.   You lose more than 2 pounds of weight or gain more than 2 pounds of weight over 1 week, or as suggested by your caregiver.   You notice swelling of your face, hands, feet, or legs.   You get exposed to German measles and have never had them.   You are exposed to fifth disease or chickenpox.   You develop belly (abdominal) pain. Round ligament discomfort is a common non-cancerous (benign) cause of abdominal pain in pregnancy. Your caregiver still must evaluate you.   You develop a bad headache that does not go away.   You develop fever, diarrhea, pain with urination, or shortness of breath.   You develop visual problems, blurry, or double vision.   You fall or are in a car accident or any kind of trauma.   There is mental or physical violence at home.  Document Released: 01/16/2001 Document Revised: 01/11/2011 Document Reviewed: 07/21/2008 [employer] Patient Information 2012 [employer]. 

## 2011-06-25 ENCOUNTER — Ambulatory Visit (INDEPENDENT_AMBULATORY_CARE_PROVIDER_SITE_OTHER): Payer: PRIVATE HEALTH INSURANCE | Admitting: Family Medicine

## 2011-06-25 ENCOUNTER — Ambulatory Visit (HOSPITAL_COMMUNITY)
Admission: RE | Admit: 2011-06-25 | Discharge: 2011-06-25 | Disposition: A | Discharge status: Home / Self Care | Payer: PRIVATE HEALTH INSURANCE | Source: Ambulatory Visit | Attending: Family Medicine | Admitting: Family Medicine

## 2011-06-25 VITALS — BP 123/74 | Wt 143.0 lb

## 2011-06-25 DIAGNOSIS — R51 Headache: Secondary | ICD-10-CM

## 2011-06-25 DIAGNOSIS — O99891 Other specified diseases and conditions complicating pregnancy: Secondary | ICD-10-CM | POA: Insufficient documentation

## 2011-06-25 DIAGNOSIS — D6851 Activated protein C resistance: Secondary | ICD-10-CM

## 2011-06-25 DIAGNOSIS — D6859 Other primary thrombophilia: Secondary | ICD-10-CM

## 2011-06-25 DIAGNOSIS — Z3480 Encounter for supervision of other normal pregnancy, unspecified trimester: Secondary | ICD-10-CM

## 2011-06-25 MED ORDER — CYCLOBENZAPRINE HCL 10 MG PO TABS: 10.0000 mg | ORAL_TABLET | Freq: Three times a day (TID) | ORAL | Status: AC | PRN

## 2011-06-25 NOTE — Progress Notes (Signed)
Pt. Confesses that she has not taken Lovenox since finding out she was pregnant. Reports headache is behind her eyes and is not significantly changed.  She reports significant photophobia.  Headache is 6/10.  Will check MRI to rule out venous thrombosis.  Also, will refer to MFM to discuss implications of not taking Lovenox.

## 2011-06-25 NOTE — Patient Instructions (Signed)

## 2011-06-25 NOTE — Progress Notes (Signed)
Patient comes in today with severe headache since Thursday afternoon.  Has not taken anything for it.  She also has a rash on her neck that is sore but not itchy for a couple of days.  Denies blurry vision or upper quadrant pain.

## 2011-07-05 ENCOUNTER — Ambulatory Visit (INDEPENDENT_AMBULATORY_CARE_PROVIDER_SITE_OTHER): Payer: PRIVATE HEALTH INSURANCE | Admitting: Obstetrics & Gynecology

## 2011-07-05 VITALS — BP 112/71 | Wt 142.0 lb

## 2011-07-05 DIAGNOSIS — O0990 Supervision of high risk pregnancy, unspecified, unspecified trimester: Secondary | ICD-10-CM

## 2011-07-05 DIAGNOSIS — R51 Headache: Secondary | ICD-10-CM

## 2011-07-05 DIAGNOSIS — O99119 Other diseases of the blood and blood-forming organs and certain disorders involving the immune mechanism complicating pregnancy, unspecified trimester: Secondary | ICD-10-CM

## 2011-07-05 DIAGNOSIS — I6350 Cerebral infarction due to unspecified occlusion or stenosis of unspecified cerebral artery: Secondary | ICD-10-CM

## 2011-07-05 DIAGNOSIS — D6859 Other primary thrombophilia: Secondary | ICD-10-CM

## 2011-07-05 DIAGNOSIS — D682 Hereditary deficiency of other clotting factors: Secondary | ICD-10-CM

## 2011-07-05 NOTE — Patient Instructions (Signed)
Return to clinic for any obstetric concerns or go to [facility] for evaluation  

## 2011-07-05 NOTE — Progress Notes (Signed)
MRI negative; Flexeril helping with headache.  Will get MFM referral to discuss implications of not being on Lovenox given her thrombophilia. Anatomy scan on 07/10/11. [facility] draw today, will follow up results. Preterm labor precautions reviewed.

## 2011-07-09 ENCOUNTER — Ambulatory Visit (HOSPITAL_COMMUNITY): Admission: RE | Admit: 2011-07-09 | Payer: PRIVATE HEALTH INSURANCE | Source: Ambulatory Visit

## 2011-07-10 ENCOUNTER — Ambulatory Visit (HOSPITAL_COMMUNITY)
Admission: RE | Admit: 2011-07-10 | Discharge: 2011-07-10 | Disposition: A | Discharge status: Home / Self Care | Payer: PRIVATE HEALTH INSURANCE | Source: Ambulatory Visit | Attending: Obstetrics & Gynecology | Admitting: Obstetrics & Gynecology

## 2011-07-10 ENCOUNTER — Telehealth: Payer: Self-pay

## 2011-07-10 VITALS — BP 119/66 | HR 99 | Wt 144.0 lb

## 2011-07-10 DIAGNOSIS — O99119 Other diseases of the blood and blood-forming organs and certain disorders involving the immune mechanism complicating pregnancy, unspecified trimester: Secondary | ICD-10-CM

## 2011-07-10 DIAGNOSIS — D6859 Other primary thrombophilia: Secondary | ICD-10-CM

## 2011-07-10 DIAGNOSIS — Z363 Encounter for antenatal screening for malformations: Secondary | ICD-10-CM | POA: Insufficient documentation

## 2011-07-10 DIAGNOSIS — Z1389 Encounter for screening for other disorder: Secondary | ICD-10-CM | POA: Insufficient documentation

## 2011-07-10 DIAGNOSIS — D682 Hereditary deficiency of other clotting factors: Secondary | ICD-10-CM

## 2011-07-10 DIAGNOSIS — O2620 Pregnancy care for patient with recurrent pregnancy loss, unspecified trimester: Secondary | ICD-10-CM | POA: Insufficient documentation

## 2011-07-10 DIAGNOSIS — Z3682 Encounter for antenatal screening for nuchal translucency: Secondary | ICD-10-CM

## 2011-07-10 DIAGNOSIS — O358XX0 Maternal care for other (suspected) fetal abnormality and damage, not applicable or unspecified: Secondary | ICD-10-CM | POA: Insufficient documentation

## 2011-07-10 LAB — ALPHA FETOPROTEIN, MATERNAL
Open Spina bifida: NEGATIVE
Osb Risk: 1:3920 {titer}

## 2011-07-10 MED ORDER — ENOXAPARIN SODIUM 60 MG/0.6ML ~~LOC~~ SOLN: 60.0000 mg | Freq: Two times a day (BID) | SUBCUTANEOUS | Status: DC

## 2011-07-10 NOTE — Telephone Encounter (Signed)
Addended by: Reva Bores on: 07/10/2011 03:36 PM   Modules accepted: Orders

## 2011-07-10 NOTE — Progress Notes (Signed)
Patient seen today  for follow up ultrasound.  See full report in [facility].  Regina Gula, MD  Reivewed OB history.  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  She has never had a venous thromboembolism.  She had elected to stop Lovinox.  Discussed potential risk to the patient by not taking Lovinox (DVT/ PE) that could be life-threatening. She was encouraged to resume taking Lovinox.  Single IUP at 18 2/7 weeks Normal anatomic fetal survey No markers associated with aneuploidy noted Normal amniotic fluid volume  Recommend follow up in 4 weeks for interval growth.

## 2011-07-10 NOTE — Telephone Encounter (Signed)
Patient is calling to know if she can get a prescription for her Lovenox 60mg  twice a day. She had stop taking it, should we cal it in? Please let me know if there is any changes to the prescriptions. And if we can go ahead and call it in for her. Thanks :)

## 2011-07-10 NOTE — Telephone Encounter (Signed)
rx sent to pharmacy

## 2011-07-11 ENCOUNTER — Encounter: Payer: Self-pay | Admitting: Obstetrics & Gynecology

## 2011-07-19 ENCOUNTER — Encounter: Payer: Self-pay | Admitting: Obstetrics & Gynecology

## 2011-07-19 ENCOUNTER — Ambulatory Visit (INDEPENDENT_AMBULATORY_CARE_PROVIDER_SITE_OTHER): Payer: PRIVATE HEALTH INSURANCE | Admitting: Obstetrics & Gynecology

## 2011-07-19 ENCOUNTER — Encounter: Payer: PRIVATE HEALTH INSURANCE | Admitting: Obstetrics & Gynecology

## 2011-07-19 VITALS — BP 114/67 | Wt 141.0 lb

## 2011-07-19 DIAGNOSIS — O0990 Supervision of high risk pregnancy, unspecified, unspecified trimester: Secondary | ICD-10-CM

## 2011-07-19 DIAGNOSIS — D6859 Other primary thrombophilia: Secondary | ICD-10-CM

## 2011-07-19 DIAGNOSIS — O99119 Other diseases of the blood and blood-forming organs and certain disorders involving the immune mechanism complicating pregnancy, unspecified trimester: Secondary | ICD-10-CM

## 2011-07-19 NOTE — Progress Notes (Signed)
Routine visit. No problems. + FM. She plans to get her Lovenox from the pharmacy today.

## 2011-08-07 ENCOUNTER — Encounter: Payer: PRIVATE HEALTH INSURANCE | Admitting: Family Medicine

## 2011-08-07 ENCOUNTER — Ambulatory Visit (HOSPITAL_COMMUNITY): Payer: PRIVATE HEALTH INSURANCE

## 2011-08-13 ENCOUNTER — Encounter (HOSPITAL_COMMUNITY): Payer: Self-pay

## 2011-08-13 ENCOUNTER — Ambulatory Visit (HOSPITAL_COMMUNITY)
Admission: RE | Admit: 2011-08-13 | Discharge: 2011-08-13 | Disposition: A | Discharge status: Home / Self Care | Payer: PRIVATE HEALTH INSURANCE | Source: Ambulatory Visit | Attending: Obstetrics & Gynecology | Admitting: Obstetrics & Gynecology

## 2011-08-13 VITALS — BP 111/72 | HR 102 | Wt 146.0 lb

## 2011-08-13 DIAGNOSIS — D689 Coagulation defect, unspecified: Secondary | ICD-10-CM | POA: Insufficient documentation

## 2011-08-13 DIAGNOSIS — O99119 Other diseases of the blood and blood-forming organs and certain disorders involving the immune mechanism complicating pregnancy, unspecified trimester: Secondary | ICD-10-CM | POA: Insufficient documentation

## 2011-08-13 DIAGNOSIS — D682 Hereditary deficiency of other clotting factors: Secondary | ICD-10-CM

## 2011-08-13 DIAGNOSIS — O2620 Pregnancy care for patient with recurrent pregnancy loss, unspecified trimester: Secondary | ICD-10-CM | POA: Insufficient documentation

## 2011-08-13 DIAGNOSIS — D6859 Other primary thrombophilia: Secondary | ICD-10-CM | POA: Insufficient documentation

## 2011-08-13 DIAGNOSIS — I6350 Cerebral infarction due to unspecified occlusion or stenosis of unspecified cerebral artery: Secondary | ICD-10-CM

## 2011-08-13 NOTE — Progress Notes (Signed)
Patient seen today  for follow up ultrasound.  See full report in [facility].  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  Single IUP at 23 1/7 weeks Interval growth is appropriate (56th %tile) Normal interval anatomy Normal amniotic fluid volume  Recommend follow up ultrasound in 4 weeks for growth.

## 2011-08-14 ENCOUNTER — Ambulatory Visit (INDEPENDENT_AMBULATORY_CARE_PROVIDER_SITE_OTHER): Payer: PRIVATE HEALTH INSURANCE | Admitting: Family Medicine

## 2011-08-14 ENCOUNTER — Encounter: Payer: Self-pay | Admitting: Family Medicine

## 2011-08-14 DIAGNOSIS — Z3480 Encounter for supervision of other normal pregnancy, unspecified trimester: Secondary | ICD-10-CM

## 2011-08-14 NOTE — Progress Notes (Signed)
Following up with MFM q 3 wks.  Taking Lovenox full treatment daily.  She reports good fetal movement.

## 2011-08-14 NOTE — Patient Instructions (Signed)
Pregnancy - Second Trimester The second trimester of pregnancy (3 to 6 months) is a period of rapid growth for you and your baby. At the end of the sixth month, your baby is about 9 inches long and weighs 1 1/2 pounds. You will begin to feel the baby move between 18 and 20 weeks of the pregnancy. This is called quickening. Weight gain is faster. A clear fluid (colostrum) may leak out of your breasts. You may feel small contractions of the womb (uterus). This is known as false labor or Braxton-Hicks contractions. This is like a practice for labor when the baby is ready to be born. Usually, the problems with morning sickness have usually passed by the end of your first trimester. Some women develop small dark blotches (called cholasma, mask of pregnancy) on their face that usually goes away after the baby is born. Exposure to the sun makes the blotches worse. Acne may also develop in some pregnant women and pregnant women who have acne, may find that it goes away. PRENATAL EXAMS  Blood work may continue to be done during prenatal exams. These tests are done to check on your health and the probable health of your baby. Blood work is used to follow your blood levels (hemoglobin). Anemia (low hemoglobin) is common during pregnancy. Iron and vitamins are given to help prevent this. You will also be checked for diabetes between 24 and 28 weeks of the pregnancy. Some of the previous blood tests may be repeated.   The size of the uterus is measured during each visit. This is to make sure that the baby is continuing to grow properly according to the dates of the pregnancy.   Your blood pressure is checked every prenatal visit. This is to make sure you are not getting toxemia.   Your urine is checked to make sure you do not have an infection, diabetes or protein in the urine.   Your weight is checked often to make sure gains are happening at the suggested rate. This is to ensure that both you and your baby are  growing normally.   Sometimes, an ultrasound is performed to confirm the proper growth and development of the baby. This is a test which bounces harmless sound waves off the baby so your caregiver can more accurately determine due dates.  Sometimes, a specialized test is done on the amniotic fluid surrounding the baby. This test is called an amniocentesis. The amniotic fluid is obtained by sticking a needle into the belly (abdomen). This is done to check the chromosomes in instances where there is a concern about possible genetic problems with the baby. It is also sometimes done near the end of pregnancy if an early delivery is required. In this case, it is done to help make sure the baby's lungs are mature enough for the baby to live outside of the womb. CHANGES OCCURING IN THE SECOND TRIMESTER OF PREGNANCY Your body goes through many changes during pregnancy. They vary from person to person. Talk to your caregiver about changes you notice that you are concerned about.  During the second trimester, you will likely have an increase in your appetite. It is normal to have cravings for certain foods. This varies from person to person and pregnancy to pregnancy.   Your lower abdomen will begin to bulge.   You may have to urinate more often because the uterus and baby are pressing on your bladder. It is also common to get more bladder infections during pregnancy (  pain with urination). You can help this by drinking lots of fluids and emptying your bladder before and after intercourse.   You may begin to get stretch marks on your hips, abdomen, and breasts. These are normal changes in the body during pregnancy. There are no exercises or medications to take that prevent this change.   You may begin to develop swollen and bulging veins (varicose veins) in your legs. Wearing support hose, elevating your feet for 15 minutes, 3 to 4 times a day and limiting salt in your diet helps lessen the problem.    Heartburn may develop as the uterus grows and pushes up against the stomach. Antacids recommended by your caregiver helps with this problem. Also, eating smaller meals 4 to 5 times a day helps.   Constipation can be treated with a stool softener or adding bulk to your diet. Drinking lots of fluids, vegetables, fruits, and whole grains are helpful.   Exercising is also helpful. If you have been very active up until your pregnancy, most of these activities can be continued during your pregnancy. If you have been less active, it is helpful to start an exercise program such as walking.   Hemorrhoids (varicose veins in the rectum) may develop at the end of the second trimester. Warm sitz baths and hemorrhoid cream recommended by your caregiver helps hemorrhoid problems.   Backaches may develop during this time of your pregnancy. Avoid heavy lifting, wear low heal shoes and practice good posture to help with backache problems.   Some pregnant women develop tingling and numbness of their hand and fingers because of swelling and tightening of ligaments in the wrist (carpel tunnel syndrome). This goes away after the baby is born.   As your breasts enlarge, you may have to get a bigger bra. Get a comfortable, cotton, support bra. Do not get a nursing bra until the last month of the pregnancy if you will be nursing the baby.   You may get a dark line from your belly button to the pubic area called the linea nigra.   You may develop rosy cheeks because of increase blood flow to the face.   You may develop spider looking lines of the face, neck, arms and chest. These go away after the baby is born.  HOME CARE INSTRUCTIONS   It is extremely important to avoid all smoking, herbs, alcohol, and unprescribed drugs during your pregnancy. These chemicals affect the formation and growth of the baby. Avoid these chemicals throughout the pregnancy to ensure the delivery of a healthy infant.   Most of your home  care instructions are the same as suggested for the first trimester of your pregnancy. Keep your caregiver's appointments. Follow your caregiver's instructions regarding medication use, exercise and diet.   During pregnancy, you are providing food for you and your baby. Continue to eat regular, well-balanced meals. Choose foods such as meat, fish, milk and other low fat dairy products, vegetables, fruits, and whole-grain breads and cereals. Your caregiver will tell you of the ideal weight gain.   A physical sexual relationship may be continued up until near the end of pregnancy if there are no other problems. Problems could include early (premature) leaking of amniotic fluid from the membranes, vaginal bleeding, abdominal pain, or other medical or pregnancy problems.   Exercise regularly if there are no restrictions. Check with your caregiver if you are unsure of the safety of some of your exercises. The greatest weight gain will occur in the   last 2 trimesters of pregnancy. Exercise will help you:   Control your weight.   Get you in shape for labor and delivery.   Lose weight after you have the baby.   Wear a good support or jogging bra for breast tenderness during pregnancy. This may help if worn during sleep. Pads or tissues may be used in the bra if you are leaking colostrum.   Do not use hot tubs, steam rooms or saunas throughout the pregnancy.   Wear your seat belt at all times when driving. This protects you and your baby if you are in an accident.   Avoid raw meat, uncooked cheese, cat litter boxes and soil used by cats. These carry germs that can cause birth defects in the baby.   The second trimester is also a good time to visit your dentist for your dental health if this has not been done yet. Getting your teeth cleaned is OK. Use a soft toothbrush. Brush gently during pregnancy.   It is easier to loose urine during pregnancy. Tightening up and strengthening the pelvic muscles will  help with this problem. Practice stopping your urination while you are going to the bathroom. These are the same muscles you need to strengthen. It is also the muscles you would use as if you were trying to stop from passing gas. You can practice tightening these muscles up 10 times a set and repeating this about 3 times per day. Once you know what muscles to tighten up, do not perform these exercises during urination. It is more likely to contribute to an infection by backing up the urine.   Ask for help if you have financial, counseling or nutritional needs during pregnancy. Your caregiver will be able to offer counseling for these needs as well as refer you for other special needs.   Your skin may become oily. If so, wash your face with mild soap, use non-greasy moisturizer and oil or cream based makeup.  MEDICATIONS AND DRUG USE IN PREGNANCY  Take prenatal vitamins as directed. The vitamin should contain 1 milligram of folic acid. Keep all vitamins out of reach of children. Only a couple vitamins or tablets containing iron may be fatal to a baby or young child when ingested.   Avoid use of all medications, including herbs, over-the-counter medications, not prescribed or suggested by your caregiver. Only take over-the-counter or prescription medicines for pain, discomfort, or fever as directed by your caregiver. Do not use aspirin.   Let your caregiver also know about herbs you may be using.   Alcohol is related to a number of birth defects. This includes fetal alcohol syndrome. All alcohol, in any form, should be avoided completely. Smoking will cause low birth rate and premature babies.   Street or illegal drugs are very harmful to the baby. They are absolutely forbidden. A baby born to an addicted mother will be addicted at birth. The baby will go through the same withdrawal an adult does.  SEEK MEDICAL CARE IF:  You have any concerns or worries during your pregnancy. It is better to call with  your questions if you feel they cannot wait, rather than worry about them. SEEK IMMEDIATE MEDICAL CARE IF:   An unexplained oral temperature above 102 F (38.9 C) develops, or as your caregiver suggests.   You have leaking of fluid from the vagina (birth canal). If leaking membranes are suspected, take your temperature and tell your caregiver of this when you call.   There   is vaginal spotting, bleeding, or passing clots. Tell your caregiver of the amount and how many pads are used. Light spotting in pregnancy is common, especially following intercourse.   You develop a bad smelling vaginal discharge with a change in the color from clear to white.   You continue to feel sick to your stomach (nauseated) and have no relief from remedies suggested. You vomit blood or coffee ground-like materials.   You lose more than 2 pounds of weight or gain more than 2 pounds of weight over 1 week, or as suggested by your caregiver.   You notice swelling of your face, hands, feet, or legs.   You get exposed to German measles and have never had them.   You are exposed to fifth disease or chickenpox.   You develop belly (abdominal) pain. Round ligament discomfort is a common non-cancerous (benign) cause of abdominal pain in pregnancy. Your caregiver still must evaluate you.   You develop a bad headache that does not go away.   You develop fever, diarrhea, pain with urination, or shortness of breath.   You develop visual problems, blurry, or double vision.   You fall or are in a car accident or any kind of trauma.   There is mental or physical violence at home.  Document Released: 01/16/2001 Document Revised: 01/11/2011 Document Reviewed: 07/21/2008 [employer] Patient Information 2012 [employer]. Breastfeeding BENEFITS OF BREASTFEEDING For the baby  The first milk (colostrum) helps the baby's digestive system function better.   There are antibodies from the mother in the milk that help the  baby fight off infections.   The baby has a lower incidence of asthma, allergies, and SIDS (sudden infant death syndrome).   The nutrients in breast milk are better than formulas for the baby and helps the baby's brain grow better.   Babies who breastfeed have less gas, colic, and constipation.  For the mother  Breastfeeding helps develop a very special bond between mother and baby.   It is more convenient, always available at the correct temperature and cheaper than formula feeding.   It burns calories in the mother and helps with losing weight that was gained during pregnancy.   It makes the uterus contract back down to normal size faster and slows bleeding following delivery.   Breastfeeding mothers have a lower risk of developing breast cancer.  NURSE FREQUENTLY  A healthy, full-term baby may breastfeed as often as every hour or space his or her feedings to every 3 hours.   How often to nurse will vary from baby to baby. Watch your baby for signs of hunger, not the clock.   Nurse as often as the baby requests, or when you feel the need to reduce the fullness of your breasts.   Awaken the baby if it has been 3 to 4 hours since the last feeding.   Frequent feeding will help the mother make more milk and will prevent problems like sore nipples and engorgement of the breasts.  BABY'S POSITION AT THE BREAST  Whether lying down or sitting, be sure that the baby's tummy is facing your tummy.   Support the breast with 4 fingers underneath the breast and the thumb above. Make sure your fingers are well away from the nipple and baby's mouth.   Stroke the baby's lips and cheek closest to the breast gently with your finger or nipple.   When the baby's mouth is open wide enough, place all of your nipple and as much   of the dark area around the nipple as possible into your baby's mouth.   Pull the baby in close so the tip of the nose and the baby's cheeks touch the breast during the  feeding.  FEEDINGS  The length of each feeding varies from baby to baby and from feeding to feeding.   The baby must suck about 2 to 3 minutes for your milk to get to him or her. This is called a "let down." For this reason, allow the baby to feed on each breast as long as he or she wants. Your baby will end the feeding when he or she has received the right balance of nutrients.   To break the suction, put your finger into the corner of the baby's mouth and slide it between his or her gums before removing your breast from his or her mouth. This will help prevent sore nipples.  REDUCING BREAST ENGORGEMENT  In the first week after your baby is born, you may experience signs of breast engorgement. When breasts are engorged, they feel heavy, warm, full, and may be tender to the touch. You can reduce engorgement if you:   Nurse frequently, every 2 to 3 hours. Mothers who breastfeed early and often have fewer problems with engorgement.   Place light ice packs on your breasts between feedings. This reduces swelling. Wrap the ice packs in a lightweight towel to protect your skin.   Apply moist hot packs to your breast for 5 to 10 minutes before each feeding. This increases circulation and helps the milk flow.   Gently massage your breast before and during the feeding.   Make sure that the baby empties at least one breast at every feeding before switching sides.   Use a breast pump to empty the breasts if your baby is sleepy or not nursing well. You may also want to pump if you are returning to work or or you feel you are getting engorged.   Avoid bottle feeds, pacifiers or supplemental feedings of water or juice in place of breastfeeding.   Be sure the baby is latched on and positioned properly while breastfeeding.   Prevent fatigue, stress, and anemia.   Wear a supportive bra, avoiding underwire styles.   Eat a balanced diet with enough fluids.  If you follow these suggestions, your  engorgement should improve in 24 to 48 hours. If you are still experiencing difficulty, call your lactation consultant or caregiver. IS MY BABY GETTING ENOUGH MILK? Sometimes, mothers worry about whether their babies are getting enough milk. You can be assured that your baby is getting enough milk if:  The baby is actively sucking and you hear swallowing.   The baby nurses at least 8 to 12 times in a 24 hour time period. Nurse your baby until he or she unlatches or falls asleep at the first breast (at least 10 to 20 minutes), then offer the second side.   The baby is wetting 5 to 6 disposable diapers (6 to 8 cloth diapers) in a 24 hour period by 5 to 6 days of age.   The baby is having at least 2 to 3 stools every 24 hours for the first few months. Breast milk is all the food your baby needs. It is not necessary for your baby to have water or formula. In fact, to help your breasts make more milk, it is best not to give your baby supplemental feedings during the early weeks.   The stool should   be soft and yellow.   The baby should gain 4 to 7 ounces per week after he is 4 days old.  TAKE CARE OF YOURSELF Take care of your breasts by:  Bathing or showering daily.   Avoiding the use of soaps on your nipples.   Start feedings on your left breast at one feeding and on your right breast at the next feeding.   You will notice an increase in your milk supply 2 to 5 days after delivery. You may feel some discomfort from engorgement, which makes your breasts very firm and often tender. Engorgement "peaks" out within 24 to 48 hours. In the meantime, apply warm moist towels to your breasts for 5 to 10 minutes before feeding. Gentle massage and expression of some milk before feeding will soften your breasts, making it easier for your baby to latch on. Wear a well fitting nursing bra and air dry your nipples for 10 to 15 minutes after each feeding.   Only use cotton bra pads.   Only use pure lanolin on  your nipples after nursing. You do not need to wash it off before nursing.  Take care of yourself by:   Eating well-balanced meals and nutritious snacks.   Drinking milk, fruit juice, and water to satisfy your thirst (about 8 glasses a day).   Getting plenty of rest.   Increasing calcium in your diet (1200 mg a day).   Avoiding foods that you notice affect the baby in a bad way.  SEEK MEDICAL CARE IF:   You have any questions or difficulty with breastfeeding.   You need help.   You have a hard, red, sore area on your breast, accompanied by a fever of 100.5 F (38.1 C) or more.   Your baby is too sleepy to eat well or is having trouble sleeping.   Your baby is wetting less than 6 diapers per day, by 5 days of age.   Your baby's skin or white part of his or her eyes is more yellow than it was in the hospital.   You feel depressed.  Document Released: 01/22/2005 Document Revised: 01/11/2011 Document Reviewed: 09/06/2008 [employer] Patient Information 2012 [employer]. 

## 2011-08-28 ENCOUNTER — Ambulatory Visit (INDEPENDENT_AMBULATORY_CARE_PROVIDER_SITE_OTHER): Payer: PRIVATE HEALTH INSURANCE | Admitting: Obstetrics & Gynecology

## 2011-08-28 VITALS — BP 107/72 | Wt 146.0 lb

## 2011-08-28 DIAGNOSIS — D689 Coagulation defect, unspecified: Secondary | ICD-10-CM

## 2011-08-28 DIAGNOSIS — O0990 Supervision of high risk pregnancy, unspecified, unspecified trimester: Secondary | ICD-10-CM

## 2011-08-28 DIAGNOSIS — D6859 Other primary thrombophilia: Secondary | ICD-10-CM

## 2011-08-28 NOTE — Patient Instructions (Addendum)

## 2011-08-28 NOTE — Progress Notes (Signed)
Will meet with MFM in August. On Lovenox, no problems. No other complaints or concerns.  Fetal movement and labor precautions reviewed.  1 hr GTT, TDap vaccine and other third trimester labs next visit.

## 2011-08-28 NOTE — Progress Notes (Signed)
Routine ob check, doing well, will do glucola at next visit.

## 2011-09-04 ENCOUNTER — Other Ambulatory Visit (INDEPENDENT_AMBULATORY_CARE_PROVIDER_SITE_OTHER): Payer: PRIVATE HEALTH INSURANCE | Admitting: *Deleted

## 2011-09-04 DIAGNOSIS — Z3480 Encounter for supervision of other normal pregnancy, unspecified trimester: Secondary | ICD-10-CM

## 2011-09-04 DIAGNOSIS — Z23 Encounter for immunization: Secondary | ICD-10-CM

## 2011-09-04 NOTE — Progress Notes (Signed)
Patient fell yesterday at [employer] and cut her leg.  She is due to come next week and receive her tdap and would like to go ahead and get it today.  The cut on her leg is approximately 3 inches long, it is not deep.  The cut was cleaned and bandaged.

## 2011-09-11 ENCOUNTER — Ambulatory Visit (HOSPITAL_COMMUNITY)
Admission: RE | Admit: 2011-09-11 | Discharge: 2011-09-11 | Disposition: A | Discharge status: Home / Self Care | Payer: PRIVATE HEALTH INSURANCE | Source: Ambulatory Visit | Attending: Obstetrics & Gynecology | Admitting: Obstetrics & Gynecology

## 2011-09-11 ENCOUNTER — Ambulatory Visit (INDEPENDENT_AMBULATORY_CARE_PROVIDER_SITE_OTHER): Payer: PRIVATE HEALTH INSURANCE | Admitting: Obstetrics and Gynecology

## 2011-09-11 ENCOUNTER — Ambulatory Visit (HOSPITAL_COMMUNITY): Payer: PRIVATE HEALTH INSURANCE

## 2011-09-11 VITALS — BP 99/70 | Wt 147.0 lb

## 2011-09-11 VITALS — BP 112/71 | HR 113 | Wt 148.0 lb

## 2011-09-11 DIAGNOSIS — D682 Hereditary deficiency of other clotting factors: Secondary | ICD-10-CM

## 2011-09-11 DIAGNOSIS — I6350 Cerebral infarction due to unspecified occlusion or stenosis of unspecified cerebral artery: Secondary | ICD-10-CM

## 2011-09-11 DIAGNOSIS — D6859 Other primary thrombophilia: Secondary | ICD-10-CM

## 2011-09-11 DIAGNOSIS — O99891 Other specified diseases and conditions complicating pregnancy: Secondary | ICD-10-CM | POA: Insufficient documentation

## 2011-09-11 DIAGNOSIS — D689 Coagulation defect, unspecified: Secondary | ICD-10-CM | POA: Insufficient documentation

## 2011-09-11 DIAGNOSIS — O2620 Pregnancy care for patient with recurrent pregnancy loss, unspecified trimester: Secondary | ICD-10-CM | POA: Insufficient documentation

## 2011-09-11 DIAGNOSIS — O0990 Supervision of high risk pregnancy, unspecified, unspecified trimester: Secondary | ICD-10-CM

## 2011-09-11 DIAGNOSIS — I808 Phlebitis and thrombophlebitis of other sites: Secondary | ICD-10-CM

## 2011-09-11 DIAGNOSIS — O99119 Other diseases of the blood and blood-forming organs and certain disorders involving the immune mechanism complicating pregnancy, unspecified trimester: Secondary | ICD-10-CM

## 2011-09-11 DIAGNOSIS — Q796 Ehlers-Danlos syndrome, unspecified: Secondary | ICD-10-CM | POA: Insufficient documentation

## 2011-09-11 LAB — CBC
Platelets: 199 10*3/uL (ref 150–400)
RBC: 3.85 MIL/uL — ABNORMAL LOW (ref 3.87–5.11)
WBC: 16.6 10*3/uL — ABNORMAL HIGH (ref 4.0–10.5)

## 2011-09-11 MED ORDER — TETANUS-DIPHTH-ACELL PERTUSSIS 5-2.5-18.5 LF-MCG/0.5 IM SUSP: 0.5000 mL | Freq: Once | INTRAMUSCULAR | Status: DC

## 2011-09-11 NOTE — Addendum Note (Signed)
Addended by: Catalina Antigua on: 09/11/2011 09:06 AM   Modules accepted: Orders

## 2011-09-11 NOTE — Addendum Note (Signed)
Addended by: Kara Melching on: 09/11/2011 09:34 AM   Modules accepted: Orders  

## 2011-09-11 NOTE — Progress Notes (Signed)
Patient doing well without complaints. Patient taking lovenox as prescribed. 1hr GCT and labs today. F/U ultrasound scheduled for today at 1pm

## 2011-09-11 NOTE — Progress Notes (Signed)
Regina Butler was seen for ultrasound appointment today.  Please see [facility] report for details.

## 2011-09-12 LAB — RPR

## 2011-09-14 LAB — GLUCOSE TOLERANCE, 1 HOUR (50G) W/O FASTING: Glucose, 1 Hour GTT: 112 mg/dL (ref 70–140)

## 2011-09-17 ENCOUNTER — Encounter: Payer: Self-pay | Admitting: Obstetrics and Gynecology

## 2011-09-26 ENCOUNTER — Ambulatory Visit (INDEPENDENT_AMBULATORY_CARE_PROVIDER_SITE_OTHER): Payer: PRIVATE HEALTH INSURANCE | Admitting: Family Medicine

## 2011-09-26 ENCOUNTER — Encounter: Payer: Self-pay | Admitting: Family Medicine

## 2011-09-26 VITALS — BP 130/79 | Wt 148.0 lb

## 2011-09-26 DIAGNOSIS — N898 Other specified noninflammatory disorders of vagina: Secondary | ICD-10-CM

## 2011-09-26 DIAGNOSIS — Z3480 Encounter for supervision of other normal pregnancy, unspecified trimester: Secondary | ICD-10-CM

## 2011-09-26 DIAGNOSIS — O26899 Other specified pregnancy related conditions, unspecified trimester: Secondary | ICD-10-CM

## 2011-09-26 NOTE — Progress Notes (Signed)
1 hour is 112  Having vaginal discharge-clear, like staying wet.  Back pain.  No bleeding.  SSE:  No pooling, nitrazine neg, ferning neg. Wet prep sent.  Seeing MFM next wk.  Last U/S growth showed appropriate growth and nml fluid.  Complains of rash on trunk.  Treated for tinea in past.-->c/w tinea versicolor--trial of Selsun Blue.

## 2011-09-26 NOTE — Patient Instructions (Signed)
Pregnancy - Third Trimester The third trimester of pregnancy (the last 3 months) is a period of the most rapid growth for you and your baby. The baby approaches a length of 20 inches and a weight of 6 to 10 pounds. The baby is adding on fat and getting ready for life outside your body. While inside, babies have periods of sleeping and waking, suck their thumbs, and hiccups. You can often feel small contractions of the uterus. This is false labor. It is also called Braxton-Hicks contractions. This is like a practice for labor. The usual problems in this stage of pregnancy include more difficulty breathing, swelling of the hands and feet from water retention, and having to urinate more often because of the uterus and baby pressing on your bladder.  PRENATAL EXAMS  Blood work may continue to be done during prenatal exams. These tests are done to check on your health and the probable health of your baby. Blood work is used to follow your blood levels (hemoglobin). Anemia (low hemoglobin) is common during pregnancy. Iron and vitamins are given to help prevent this. You may also continue to be checked for diabetes. Some of the past blood tests may be done again.   The size of the uterus is measured during each visit. This makes sure your baby is growing properly according to your pregnancy dates.   Your blood pressure is checked every prenatal visit. This is to make sure you are not getting toxemia.   Your urine is checked every prenatal visit for infection, diabetes and protein.   Your weight is checked at each visit. This is done to make sure gains are happening at the suggested rate and that you and your baby are growing normally.   Sometimes, an ultrasound is performed to confirm the position and the proper growth and development of the baby. This is a test done that bounces harmless sound waves off the baby so your caregiver can more accurately determine due dates.   Discuss the type of pain  medication and anesthesia you will have during your labor and delivery.   Discuss the possibility and anesthesia if a Cesarean Section might be necessary.   Inform your caregiver if there is any mental or physical violence at home.  Sometimes, a specialized non-stress test, contraction stress test and biophysical profile are done to make sure the baby is not having a problem. Checking the amniotic fluid surrounding the baby is called an amniocentesis. The amniotic fluid is removed by sticking a needle into the belly (abdomen). This is sometimes done near the end of pregnancy if an early delivery is required. In this case, it is done to help make sure the baby's lungs are mature enough for the baby to live outside of the womb. If the lungs are not mature and it is unsafe to deliver the baby, an injection of cortisone medication is given to the mother 1 to 2 days before the delivery. This helps the baby's lungs mature and makes it safer to deliver the baby. CHANGES OCCURING IN THE THIRD TRIMESTER OF PREGNANCY Your body goes through many changes during pregnancy. They vary from person to person. Talk to your caregiver about changes you notice and are concerned about.  During the last trimester, you have probably had an increase in your appetite. It is normal to have cravings for certain foods. This varies from person to person and pregnancy to pregnancy.   You may begin to get stretch marks on your hips,   abdomen, and breasts. These are normal changes in the body during pregnancy. There are no exercises or medications to take which prevent this change.   Constipation may be treated with a stool softener or adding bulk to your diet. Drinking lots of fluids, fiber in vegetables, fruits, and whole grains are helpful.   Exercising is also helpful. If you have been very active up until your pregnancy, most of these activities can be continued during your pregnancy. If you have been less active, it is helpful  to start an exercise program such as walking. Consult your caregiver before starting exercise programs.   Avoid all smoking, alcohol, un-prescribed drugs, herbs and "street drugs" during your pregnancy. These chemicals affect the formation and growth of the baby. Avoid chemicals throughout the pregnancy to ensure the delivery of a healthy infant.   Backache, varicose veins and hemorrhoids may develop or get worse.   You will tire more easily in the third trimester, which is normal.   The baby's movements may be stronger and more often.   You may become short of breath easily.   Your belly button may stick out.   A yellow discharge may leak from your breasts called colostrum.   You may have a bloody mucus discharge. This usually occurs a few days to a week before labor begins.  HOME CARE INSTRUCTIONS   Keep your caregiver's appointments. Follow your caregiver's instructions regarding medication use, exercise, and diet.   During pregnancy, you are providing food for you and your baby. Continue to eat regular, well-balanced meals. Choose foods such as meat, fish, milk and other low fat dairy products, vegetables, fruits, and whole-grain breads and cereals. Your caregiver will tell you of the ideal weight gain.   A physical sexual relationship may be continued throughout pregnancy if there are no other problems such as early (premature) leaking of amniotic fluid from the membranes, vaginal bleeding, or belly (abdominal) pain.   Exercise regularly if there are no restrictions. Check with your caregiver if you are unsure of the safety of your exercises. Greater weight gain will occur in the last 2 trimesters of pregnancy. Exercising helps:   Control your weight.   Get you in shape for labor and delivery.   You lose weight after you deliver.   Rest a lot with legs elevated, or as needed for leg cramps or low back pain.   Wear a good support or jogging bra for breast tenderness during  pregnancy. This may help if worn during sleep. Pads or tissues may be used in the bra if you are leaking colostrum.   Do not use hot tubs, steam rooms, or saunas.   Wear your seat belt when driving. This protects you and your baby if you are in an accident.   Avoid raw meat, cat litter boxes and soil used by cats. These carry germs that can cause birth defects in the baby.   It is easier to loose urine during pregnancy. Tightening up and strengthening the pelvic muscles will help with this problem. You can practice stopping your urination while you are going to the bathroom. These are the same muscles you need to strengthen. It is also the muscles you would use if you were trying to stop from passing gas. You can practice tightening these muscles up 10 times a set and repeating this about 3 times per day. Once you know what muscles to tighten up, do not perform these exercises during urination. It is more likely   to cause an infection by backing up the urine.   Ask for help if you have financial, counseling or nutritional needs during pregnancy. Your caregiver will be able to offer counseling for these needs as well as refer you for other special needs.   Make a list of emergency phone numbers and have them available.   Plan on getting help from family or friends when you go home from the hospital.   Make a trial run to the hospital.   Take prenatal classes with the father to understand, practice and ask questions about the labor and delivery.   Prepare the baby's room/nursery.   Do not travel out of the city unless it is absolutely necessary and with the advice of your caregiver.   Wear only low or no heal shoes to have better balance and prevent falling.  MEDICATIONS AND DRUG USE IN PREGNANCY  Take prenatal vitamins as directed. The vitamin should contain 1 milligram of folic acid. Keep all vitamins out of reach of children. Only a couple vitamins or tablets containing iron may be fatal  to a baby or young child when ingested.   Avoid use of all medications, including herbs, over-the-counter medications, not prescribed or suggested by your caregiver. Only take over-the-counter or prescription medicines for pain, discomfort, or fever as directed by your caregiver. Do not use aspirin, ibuprofen (Motrin, Advil, Nuprin) or naproxen (Aleve) unless OK'd by your caregiver.   Let your caregiver also know about herbs you may be using.   Alcohol is related to a number of birth defects. This includes fetal alcohol syndrome. All alcohol, in any form, should be avoided completely. Smoking will cause low birth rate and premature babies.   Street/illegal drugs are very harmful to the baby. They are absolutely forbidden. A baby born to an addicted mother will be addicted at birth. The baby will go through the same withdrawal an adult does.  SEEK MEDICAL CARE IF: You have any concerns or worries during your pregnancy. It is better to call with your questions if you feel they cannot wait, rather than worry about them. DECISIONS ABOUT CIRCUMCISION You may or may not know the sex of your baby. If you know your baby is a boy, it may be time to think about circumcision. Circumcision is the removal of the foreskin of the penis. This is the skin that covers the sensitive end of the penis. There is no proven medical need for this. Often this decision is made on what is popular at the time or based upon religious beliefs and social issues. You can discuss these issues with your caregiver or pediatrician. SEEK IMMEDIATE MEDICAL CARE IF:   An unexplained oral temperature above 102 F (38.9 C) develops, or as your caregiver suggests.   You have leaking of fluid from the vagina (birth canal). If leaking membranes are suspected, take your temperature and tell your caregiver of this when you call.   There is vaginal spotting, bleeding or passing clots. Tell your caregiver of the amount and how many pads are  used.   You develop a bad smelling vaginal discharge with a change in the color from clear to white.   You develop vomiting that lasts more than 24 hours.   You develop chills or fever.   You develop shortness of breath.   You develop burning on urination.   You loose more than 2 pounds of weight or gain more than 2 pounds of weight or as suggested by your   caregiver.   You notice sudden swelling of your face, hands, and feet or legs.   You develop belly (abdominal) pain. Round ligament discomfort is a common non-cancerous (benign) cause of abdominal pain in pregnancy. Your caregiver still must evaluate you.   You develop a severe headache that does not go away.   You develop visual problems, blurred or double vision.   If you have not felt your baby move for more than 1 hour. If you think the baby is not moving as much as usual, eat something with sugar in it and lie down on your left side for an hour. The baby should move at least 4 to 5 times per hour. Call right away if your baby moves less than that.   You fall, are in a car accident or any kind of trauma.   There is mental or physical violence at home.  Document Released: 01/16/2001 Document Revised: 01/11/2011 Document Reviewed: 07/21/2008 [employer] Patient Information 2012 [employer]. Breastfeeding BENEFITS OF BREASTFEEDING For the baby  The first milk (colostrum) helps the baby's digestive system function better.   There are antibodies from the mother in the milk that help the baby fight off infections.   The baby has a lower incidence of asthma, allergies, and SIDS (sudden infant death syndrome).   The nutrients in breast milk are better than formulas for the baby and helps the baby's brain grow better.   Babies who breastfeed have less gas, colic, and constipation.  For the mother  Breastfeeding helps develop a very special bond between mother and baby.   It is more convenient, always available at the  correct temperature and cheaper than formula feeding.   It burns calories in the mother and helps with losing weight that was gained during pregnancy.   It makes the uterus contract back down to normal size faster and slows bleeding following delivery.   Breastfeeding mothers have a lower risk of developing breast cancer.  NURSE FREQUENTLY  A healthy, full-term baby may breastfeed as often as every hour or space his or her feedings to every 3 hours.   How often to nurse will vary from baby to baby. Watch your baby for signs of hunger, not the clock.   Nurse as often as the baby requests, or when you feel the need to reduce the fullness of your breasts.   Awaken the baby if it has been 3 to 4 hours since the last feeding.   Frequent feeding will help the mother make more milk and will prevent problems like sore nipples and engorgement of the breasts.  BABY'S POSITION AT THE BREAST  Whether lying down or sitting, be sure that the baby's tummy is facing your tummy.   Support the breast with 4 fingers underneath the breast and the thumb above. Make sure your fingers are well away from the nipple and baby's mouth.   Stroke the baby's lips and cheek closest to the breast gently with your finger or nipple.   When the baby's mouth is open wide enough, place all of your nipple and as much of the dark area around the nipple as possible into your baby's mouth.   Pull the baby in close so the tip of the nose and the baby's cheeks touch the breast during the feeding.  FEEDINGS  The length of each feeding varies from baby to baby and from feeding to feeding.   The baby must suck about 2 to 3 minutes for your milk   to get to him or her. This is called a "let down." For this reason, allow the baby to feed on each breast as long as he or she wants. Your baby will end the feeding when he or she has received the right balance of nutrients.   To break the suction, put your finger into the corner of the  baby's mouth and slide it between his or her gums before removing your breast from his or her mouth. This will help prevent sore nipples.  REDUCING BREAST ENGORGEMENT  In the first week after your baby is born, you may experience signs of breast engorgement. When breasts are engorged, they feel heavy, warm, full, and may be tender to the touch. You can reduce engorgement if you:   Nurse frequently, every 2 to 3 hours. Mothers who breastfeed early and often have fewer problems with engorgement.   Place light ice packs on your breasts between feedings. This reduces swelling. Wrap the ice packs in a lightweight towel to protect your skin.   Apply moist hot packs to your breast for 5 to 10 minutes before each feeding. This increases circulation and helps the milk flow.   Gently massage your breast before and during the feeding.   Make sure that the baby empties at least one breast at every feeding before switching sides.   Use a breast pump to empty the breasts if your baby is sleepy or not nursing well. You may also want to pump if you are returning to work or or you feel you are getting engorged.   Avoid bottle feeds, pacifiers or supplemental feedings of water or juice in place of breastfeeding.   Be sure the baby is latched on and positioned properly while breastfeeding.   Prevent fatigue, stress, and anemia.   Wear a supportive bra, avoiding underwire styles.   Eat a balanced diet with enough fluids.  If you follow these suggestions, your engorgement should improve in 24 to 48 hours. If you are still experiencing difficulty, call your lactation consultant or caregiver. IS MY BABY GETTING ENOUGH MILK? Sometimes, mothers worry about whether their babies are getting enough milk. You can be assured that your baby is getting enough milk if:  The baby is actively sucking and you hear swallowing.   The baby nurses at least 8 to 12 times in a 24 hour time period. Nurse your baby until he or  she unlatches or falls asleep at the first breast (at least 10 to 20 minutes), then offer the second side.   The baby is wetting 5 to 6 disposable diapers (6 to 8 cloth diapers) in a 24 hour period by 5 to 6 days of age.   The baby is having at least 2 to 3 stools every 24 hours for the first few months. Breast milk is all the food your baby needs. It is not necessary for your baby to have water or formula. In fact, to help your breasts make more milk, it is best not to give your baby supplemental feedings during the early weeks.   The stool should be soft and yellow.   The baby should gain 4 to 7 ounces per week after he is 4 days old.  TAKE CARE OF YOURSELF Take care of your breasts by:  Bathing or showering daily.   Avoiding the use of soaps on your nipples.   Start feedings on your left breast at one feeding and on your right breast at the next feeding.     You will notice an increase in your milk supply 2 to 5 days after delivery. You may feel some discomfort from engorgement, which makes your breasts very firm and often tender. Engorgement "peaks" out within 24 to 48 hours. In the meantime, apply warm moist towels to your breasts for 5 to 10 minutes before feeding. Gentle massage and expression of some milk before feeding will soften your breasts, making it easier for your baby to latch on. Wear a well fitting nursing bra and air dry your nipples for 10 to 15 minutes after each feeding.   Only use cotton bra pads.   Only use pure lanolin on your nipples after nursing. You do not need to wash it off before nursing.  Take care of yourself by:   Eating well-balanced meals and nutritious snacks.   Drinking milk, fruit juice, and water to satisfy your thirst (about 8 glasses a day).   Getting plenty of rest.   Increasing calcium in your diet (1200 mg a day).   Avoiding foods that you notice affect the baby in a bad way.  SEEK MEDICAL CARE IF:   You have any questions or difficulty  with breastfeeding.   You need help.   You have a hard, red, sore area on your breast, accompanied by a fever of 100.5 F (38.1 C) or more.   Your baby is too sleepy to eat well or is having trouble sleeping.   Your baby is wetting less than 6 diapers per day, by 5 days of age.   Your baby's skin or white part of his or her eyes is more yellow than it was in the hospital.   You feel depressed.  Document Released: 01/22/2005 Document Revised: 01/11/2011 Document Reviewed: 09/06/2008 [employer] Patient Information 2012 [employer]. 

## 2011-09-27 LAB — WET PREP, GENITAL: Trich, Wet Prep: NONE SEEN

## 2011-09-27 MED ORDER — METRONIDAZOLE 500 MG PO TABS: 500.0000 mg | ORAL_TABLET | Freq: Two times a day (BID) | ORAL | Status: AC

## 2011-09-27 NOTE — Addendum Note (Signed)
Addended by: Reva Bores on: 09/27/2011 09:38 AM   Modules accepted: Orders

## 2011-10-09 ENCOUNTER — Ambulatory Visit (HOSPITAL_COMMUNITY)
Admission: RE | Admit: 2011-10-09 | Discharge: 2011-10-09 | Disposition: A | Discharge status: Home / Self Care | Payer: PRIVATE HEALTH INSURANCE | Source: Ambulatory Visit | Attending: Family Medicine | Admitting: Family Medicine

## 2011-10-09 VITALS — BP 110/72 | HR 98 | Wt 149.5 lb

## 2011-10-09 DIAGNOSIS — O2620 Pregnancy care for patient with recurrent pregnancy loss, unspecified trimester: Secondary | ICD-10-CM | POA: Insufficient documentation

## 2011-10-09 DIAGNOSIS — I6350 Cerebral infarction due to unspecified occlusion or stenosis of unspecified cerebral artery: Secondary | ICD-10-CM

## 2011-10-09 DIAGNOSIS — D6859 Other primary thrombophilia: Secondary | ICD-10-CM | POA: Insufficient documentation

## 2011-10-09 DIAGNOSIS — D689 Coagulation defect, unspecified: Secondary | ICD-10-CM | POA: Insufficient documentation

## 2011-10-09 DIAGNOSIS — O99119 Other diseases of the blood and blood-forming organs and certain disorders involving the immune mechanism complicating pregnancy, unspecified trimester: Secondary | ICD-10-CM | POA: Insufficient documentation

## 2011-10-09 DIAGNOSIS — D682 Hereditary deficiency of other clotting factors: Secondary | ICD-10-CM

## 2011-10-10 ENCOUNTER — Encounter: Payer: Self-pay | Admitting: Family Medicine

## 2011-10-10 ENCOUNTER — Ambulatory Visit (INDEPENDENT_AMBULATORY_CARE_PROVIDER_SITE_OTHER): Payer: PRIVATE HEALTH INSURANCE | Admitting: Family Medicine

## 2011-10-10 VITALS — BP 117/88 | Wt 149.0 lb

## 2011-10-10 DIAGNOSIS — D689 Coagulation defect, unspecified: Secondary | ICD-10-CM

## 2011-10-10 DIAGNOSIS — R5081 Fever presenting with conditions classified elsewhere: Secondary | ICD-10-CM

## 2011-10-10 DIAGNOSIS — Z3480 Encounter for supervision of other normal pregnancy, unspecified trimester: Secondary | ICD-10-CM

## 2011-10-10 DIAGNOSIS — D6859 Other primary thrombophilia: Secondary | ICD-10-CM

## 2011-10-10 DIAGNOSIS — R05 Cough: Secondary | ICD-10-CM

## 2011-10-10 DIAGNOSIS — J209 Acute bronchitis, unspecified: Secondary | ICD-10-CM

## 2011-10-10 MED ORDER — AZITHROMYCIN 250 MG PO TABS: ORAL_TABLET | ORAL | Status: AC

## 2011-10-10 MED ORDER — FLUTICASONE-SALMETEROL 100-50 MCG/DOSE IN AEPB: 1.0000 | INHALATION_SPRAY | Freq: Two times a day (BID) | RESPIRATORY_TRACT | Status: DC

## 2011-10-10 NOTE — Patient Instructions (Addendum)
Pregnancy - Third Trimester The third trimester of pregnancy (the last 3 months) is a period of the most rapid growth for you and your baby. The baby approaches a length of 20 inches and a weight of 6 to 10 pounds. The baby is adding on fat and getting ready for life outside your body. While inside, babies have periods of sleeping and waking, suck their thumbs, and hiccups. You can often feel small contractions of the uterus. This is false labor. It is also called Braxton-Hicks contractions. This is like a practice for labor. The usual problems in this stage of pregnancy include more difficulty breathing, swelling of the hands and feet from water retention, and having to urinate more often because of the uterus and baby pressing on your bladder.  PRENATAL EXAMS  Blood work may continue to be done during prenatal exams. These tests are done to check on your health and the probable health of your baby. Blood work is used to follow your blood levels (hemoglobin). Anemia (low hemoglobin) is common during pregnancy. Iron and vitamins are given to help prevent this. You may also continue to be checked for diabetes. Some of the past blood tests may be done again.   The size of the uterus is measured during each visit. This makes sure your baby is growing properly according to your pregnancy dates.   Your blood pressure is checked every prenatal visit. This is to make sure you are not getting toxemia.   Your urine is checked every prenatal visit for infection, diabetes and protein.   Your weight is checked at each visit. This is done to make sure gains are happening at the suggested rate and that you and your baby are growing normally.   Sometimes, an ultrasound is performed to confirm the position and the proper growth and development of the baby. This is a test done that bounces harmless sound waves off the baby so your caregiver can more accurately determine due dates.   Discuss the type of pain  medication and anesthesia you will have during your labor and delivery.   Discuss the possibility and anesthesia if a Cesarean Section might be necessary.   Inform your caregiver if there is any mental or physical violence at home.  Sometimes, a specialized non-stress test, contraction stress test and biophysical profile are done to make sure the baby is not having a problem. Checking the amniotic fluid surrounding the baby is called an amniocentesis. The amniotic fluid is removed by sticking a needle into the belly (abdomen). This is sometimes done near the end of pregnancy if an early delivery is required. In this case, it is done to help make sure the baby's lungs are mature enough for the baby to live outside of the womb. If the lungs are not mature and it is unsafe to deliver the baby, an injection of cortisone medication is given to the mother 1 to 2 days before the delivery. This helps the baby's lungs mature and makes it safer to deliver the baby. CHANGES OCCURING IN THE THIRD TRIMESTER OF PREGNANCY Your body goes through many changes during pregnancy. They vary from person to person. Talk to your caregiver about changes you notice and are concerned about.  During the last trimester, you have probably had an increase in your appetite. It is normal to have cravings for certain foods. This varies from person to person and pregnancy to pregnancy.   You may begin to get stretch marks on your hips,   abdomen, and breasts. These are normal changes in the body during pregnancy. There are no exercises or medications to take which prevent this change.   Constipation may be treated with a stool softener or adding bulk to your diet. Drinking lots of fluids, fiber in vegetables, fruits, and whole grains are helpful.   Exercising is also helpful. If you have been very active up until your pregnancy, most of these activities can be continued during your pregnancy. If you have been less active, it is helpful  to start an exercise program such as walking. Consult your caregiver before starting exercise programs.   Avoid all smoking, alcohol, un-prescribed drugs, herbs and "street drugs" during your pregnancy. These chemicals affect the formation and growth of the baby. Avoid chemicals throughout the pregnancy to ensure the delivery of a healthy infant.   Backache, varicose veins and hemorrhoids may develop or get worse.   You will tire more easily in the third trimester, which is normal.   The baby's movements may be stronger and more often.   You may become short of breath easily.   Your belly button may stick out.   A yellow discharge may leak from your breasts called colostrum.   You may have a bloody mucus discharge. This usually occurs a few days to a week before labor begins.  HOME CARE INSTRUCTIONS   Keep your caregiver's appointments. Follow your caregiver's instructions regarding medication use, exercise, and diet.   During pregnancy, you are providing food for you and your baby. Continue to eat regular, well-balanced meals. Choose foods such as meat, fish, milk and other low fat dairy products, vegetables, fruits, and whole-grain breads and cereals. Your caregiver will tell you of the ideal weight gain.   A physical sexual relationship may be continued throughout pregnancy if there are no other problems such as early (premature) leaking of amniotic fluid from the membranes, vaginal bleeding, or belly (abdominal) pain.   Exercise regularly if there are no restrictions. Check with your caregiver if you are unsure of the safety of your exercises. Greater weight gain will occur in the last 2 trimesters of pregnancy. Exercising helps:   Control your weight.   Get you in shape for labor and delivery.   You lose weight after you deliver.   Rest a lot with legs elevated, or as needed for leg cramps or low back pain.   Wear a good support or jogging bra for breast tenderness during  pregnancy. This may help if worn during sleep. Pads or tissues may be used in the bra if you are leaking colostrum.   Do not use hot tubs, steam rooms, or saunas.   Wear your seat belt when driving. This protects you and your baby if you are in an accident.   Avoid raw meat, cat litter boxes and soil used by cats. These carry germs that can cause birth defects in the baby.   It is easier to loose urine during pregnancy. Tightening up and strengthening the pelvic muscles will help with this problem. You can practice stopping your urination while you are going to the bathroom. These are the same muscles you need to strengthen. It is also the muscles you would use if you were trying to stop from passing gas. You can practice tightening these muscles up 10 times a set and repeating this about 3 times per day. Once you know what muscles to tighten up, do not perform these exercises during urination. It is more likely   to cause an infection by backing up the urine.   Ask for help if you have financial, counseling or nutritional needs during pregnancy. Your caregiver will be able to offer counseling for these needs as well as refer you for other special needs.   Make a list of emergency phone numbers and have them available.   Plan on getting help from family or friends when you go home from the hospital.   Make a trial run to the hospital.   Take prenatal classes with the father to understand, practice and ask questions about the labor and delivery.   Prepare the baby's room/nursery.   Do not travel out of the city unless it is absolutely necessary and with the advice of your caregiver.   Wear only low or no heal shoes to have better balance and prevent falling.  MEDICATIONS AND DRUG USE IN PREGNANCY  Take prenatal vitamins as directed. The vitamin should contain 1 milligram of folic acid. Keep all vitamins out of reach of children. Only a couple vitamins or tablets containing iron may be fatal  to a baby or young child when ingested.   Avoid use of all medications, including herbs, over-the-counter medications, not prescribed or suggested by your caregiver. Only take over-the-counter or prescription medicines for pain, discomfort, or fever as directed by your caregiver. Do not use aspirin, ibuprofen (Motrin, Advil, Nuprin) or naproxen (Aleve) unless OK'd by your caregiver.   Let your caregiver also know about herbs you may be using.   Alcohol is related to a number of birth defects. This includes fetal alcohol syndrome. All alcohol, in any form, should be avoided completely. Smoking will cause low birth rate and premature babies.   Street/illegal drugs are very harmful to the baby. They are absolutely forbidden. A baby born to an addicted mother will be addicted at birth. The baby will go through the same withdrawal an adult does.  SEEK MEDICAL CARE IF: You have any concerns or worries during your pregnancy. It is better to call with your questions if you feel they cannot wait, rather than worry about them. DECISIONS ABOUT CIRCUMCISION You may or may not know the sex of your baby. If you know your baby is a boy, it may be time to think about circumcision. Circumcision is the removal of the foreskin of the penis. This is the skin that covers the sensitive end of the penis. There is no proven medical need for this. Often this decision is made on what is popular at the time or based upon religious beliefs and social issues. You can discuss these issues with your caregiver or pediatrician. SEEK IMMEDIATE MEDICAL CARE IF:   An unexplained oral temperature above 102 F (38.9 C) develops, or as your caregiver suggests.   You have leaking of fluid from the vagina (birth canal). If leaking membranes are suspected, take your temperature and tell your caregiver of this when you call.   There is vaginal spotting, bleeding or passing clots. Tell your caregiver of the amount and how many pads are  used.   You develop a bad smelling vaginal discharge with a change in the color from clear to white.   You develop vomiting that lasts more than 24 hours.   You develop chills or fever.   You develop shortness of breath.   You develop burning on urination.   You loose more than 2 pounds of weight or gain more than 2 pounds of weight or as suggested by your   caregiver.   You notice sudden swelling of your face, hands, and feet or legs.   You develop belly (abdominal) pain. Round ligament discomfort is a common non-cancerous (benign) cause of abdominal pain in pregnancy. Your caregiver still must evaluate you.   You develop a severe headache that does not go away.   You develop visual problems, blurred or double vision.   If you have not felt your baby move for more than 1 hour. If you think the baby is not moving as much as usual, eat something with sugar in it and lie down on your left side for an hour. The baby should move at least 4 to 5 times per hour. Call right away if your baby moves less than that.   You fall, are in a car accident or any kind of trauma.   There is mental or physical violence at home.  Document Released: 01/16/2001 Document Revised: 01/11/2011 Document Reviewed: 07/21/2008 [employer] Patient Information 2012 [employer]. Breastfeeding BENEFITS OF BREASTFEEDING For the baby  The first milk (colostrum) helps the baby's digestive system function better.   There are antibodies from the mother in the milk that help the baby fight off infections.   The baby has a lower incidence of asthma, allergies, and SIDS (sudden infant death syndrome).   The nutrients in breast milk are better than formulas for the baby and helps the baby's brain grow better.   Babies who breastfeed have less gas, colic, and constipation.  For the mother  Breastfeeding helps develop a very special bond between mother and baby.   It is more convenient, always available at the  correct temperature and cheaper than formula feeding.   It burns calories in the mother and helps with losing weight that was gained during pregnancy.   It makes the uterus contract back down to normal size faster and slows bleeding following delivery.   Breastfeeding mothers have a lower risk of developing breast cancer.  NURSE FREQUENTLY  A healthy, full-term baby may breastfeed as often as every hour or space his or her feedings to every 3 hours.   How often to nurse will vary from baby to baby. Watch your baby for signs of hunger, not the clock.   Nurse as often as the baby requests, or when you feel the need to reduce the fullness of your breasts.   Awaken the baby if it has been 3 to 4 hours since the last feeding.   Frequent feeding will help the mother make more milk and will prevent problems like sore nipples and engorgement of the breasts.  BABY'S POSITION AT THE BREAST  Whether lying down or sitting, be sure that the baby's tummy is facing your tummy.   Support the breast with 4 fingers underneath the breast and the thumb above. Make sure your fingers are well away from the nipple and baby's mouth.   Stroke the baby's lips and cheek closest to the breast gently with your finger or nipple.   When the baby's mouth is open wide enough, place all of your nipple and as much of the dark area around the nipple as possible into your baby's mouth.   Pull the baby in close so the tip of the nose and the baby's cheeks touch the breast during the feeding.  FEEDINGS  The length of each feeding varies from baby to baby and from feeding to feeding.   The baby must suck about 2 to 3 minutes for your milk   to get to him or her. This is called a "let down." For this reason, allow the baby to feed on each breast as long as he or she wants. Your baby will end the feeding when he or she has received the right balance of nutrients.   To break the suction, put your finger into the corner of the  baby's mouth and slide it between his or her gums before removing your breast from his or her mouth. This will help prevent sore nipples.  REDUCING BREAST ENGORGEMENT  In the first week after your baby is born, you may experience signs of breast engorgement. When breasts are engorged, they feel heavy, warm, full, and may be tender to the touch. You can reduce engorgement if you:   Nurse frequently, every 2 to 3 hours. Mothers who breastfeed early and often have fewer problems with engorgement.   Place light ice packs on your breasts between feedings. This reduces swelling. Wrap the ice packs in a lightweight towel to protect your skin.   Apply moist hot packs to your breast for 5 to 10 minutes before each feeding. This increases circulation and helps the milk flow.   Gently massage your breast before and during the feeding.   Make sure that the baby empties at least one breast at every feeding before switching sides.   Use a breast pump to empty the breasts if your baby is sleepy or not nursing well. You may also want to pump if you are returning to work or or you feel you are getting engorged.   Avoid bottle feeds, pacifiers or supplemental feedings of water or juice in place of breastfeeding.   Be sure the baby is latched on and positioned properly while breastfeeding.   Prevent fatigue, stress, and anemia.   Wear a supportive bra, avoiding underwire styles.   Eat a balanced diet with enough fluids.  If you follow these suggestions, your engorgement should improve in 24 to 48 hours. If you are still experiencing difficulty, call your lactation consultant or caregiver. IS MY BABY GETTING ENOUGH MILK? Sometimes, mothers worry about whether their babies are getting enough milk. You can be assured that your baby is getting enough milk if:  The baby is actively sucking and you hear swallowing.   The baby nurses at least 8 to 12 times in a 24 hour time period. Nurse your baby until he or  she unlatches or falls asleep at the first breast (at least 10 to 20 minutes), then offer the second side.   The baby is wetting 5 to 6 disposable diapers (6 to 8 cloth diapers) in a 24 hour period by 5 to 6 days of age.   The baby is having at least 2 to 3 stools every 24 hours for the first few months. Breast milk is all the food your baby needs. It is not necessary for your baby to have water or formula. In fact, to help your breasts make more milk, it is best not to give your baby supplemental feedings during the early weeks.   The stool should be soft and yellow.   The baby should gain 4 to 7 ounces per week after he is 4 days old.  TAKE CARE OF YOURSELF Take care of your breasts by:  Bathing or showering daily.   Avoiding the use of soaps on your nipples.   Start feedings on your left breast at one feeding and on your right breast at the next feeding.     You will notice an increase in your milk supply 2 to 5 days after delivery. You may feel some discomfort from engorgement, which makes your breasts very firm and often tender. Engorgement "peaks" out within 24 to 48 hours. In the meantime, apply warm moist towels to your breasts for 5 to 10 minutes before feeding. Gentle massage and expression of some milk before feeding will soften your breasts, making it easier for your baby to latch on. Wear a well fitting nursing bra and air dry your nipples for 10 to 15 minutes after each feeding.   Only use cotton bra pads.   Only use pure lanolin on your nipples after nursing. You do not need to wash it off before nursing.  Take care of yourself by:   Eating well-balanced meals and nutritious snacks.   Drinking milk, fruit juice, and water to satisfy your thirst (about 8 glasses a day).   Getting plenty of rest.   Increasing calcium in your diet (1200 mg a day).   Avoiding foods that you notice affect the baby in a bad way.  SEEK MEDICAL CARE IF:   You have any questions or difficulty  with breastfeeding.   You need help.   You have a hard, red, sore area on your breast, accompanied by a fever of 100.5 F (38.1 C) or more.   Your baby is too sleepy to eat well or is having trouble sleeping.   Your baby is wetting less than 6 diapers per day, by 3 days of age.   Your baby's skin or white part of his or her eyes is more yellow than it was in the hospital.   You feel depressed.  Document Released: 01/22/2005 Document Revised: 01/11/2011 Document Reviewed: 09/06/2008 [facility] Patient Information 2012 [place], [place]. Bronchitis Bronchitis is the body's way of reacting to injury and/or infection (inflammation) of the bronchi. Bronchi are the air tubes that extend from the windpipe into the lungs. If the inflammation becomes severe, it may cause shortness of breath. CAUSES  Inflammation may be caused by:  A virus.   Germs (bacteria).   Dust.   Allergens.   Pollutants and many other irritants.  The cells lining the bronchial tree are covered with tiny hairs (cilia). These constantly beat upward, away from the lungs, toward the mouth. This keeps the lungs free of pollutants. When these cells become too irritated and are unable to do their job, mucus begins to develop. This causes the characteristic cough of bronchitis. The cough clears the lungs when the cilia are unable to do their job. Without either of these protective mechanisms, the mucus would settle in the lungs. Then you would develop pneumonia. Smoking is a common cause of bronchitis and can contribute to pneumonia. Stopping this habit is the single most important thing you can do to help yourself. TREATMENT   Your caregiver may prescribe an antibiotic if the cough is caused by bacteria. Also, medicines that open up your airways make it easier to breathe. Your caregiver may also recommend or prescribe an expectorant. It will loosen the mucus to be coughed up. Only take over-the-counter or prescription medicines  for pain, discomfort, or fever as directed by your caregiver.   Removing whatever causes the problem (smoking, for example) is critical to preventing the problem from getting worse.   Cough suppressants may be prescribed for relief of cough symptoms.   Inhaled medicines may be prescribed to help with symptoms now and to help prevent problems from returning.  For those with recurrent (chronic) bronchitis, there may be a need for steroid medicines.  SEEK IMMEDIATE MEDICAL CARE IF:   During treatment, you develop more pus-like mucus (purulent sputum).   You have a fever.   Your baby is older than 3 months with a rectal temperature of 102 F (38.9 C) or higher.   Your baby is 20 months old or younger with a rectal temperature of 100.4 F (38 C) or higher.   You become progressively more ill.   You have increased difficulty breathing, wheezing, or shortness of breath.  It is necessary to seek immediate medical care if you are elderly or sick from any other disease. MAKE SURE YOU:   Understand these instructions.   Will watch your condition.   Will get help right away if you are not doing well or get worse.  Document Released: 01/22/2005 Document Revised: 01/11/2011 Document Reviewed: 12/02/2007 [facility] Patient Information 2012 [place], [place].

## 2011-10-10 NOTE — Progress Notes (Signed)
Having sx's of bronchitis.  Fever, cough.  Lungs are clear. Treat with Advair and Zithromax. Begin 2x/wk testing next wk-- U/S on 9/3 showed nml fluid, nml growth 3 # 15 oz 61%.

## 2011-10-10 NOTE — Progress Notes (Signed)
Routine prenatal visit.  Patient has had a deep cough that started this weekend, she went to urgent care but due to her pregnancy and blood disorder they advised her to follow up with [place].

## 2011-10-15 ENCOUNTER — Encounter: Payer: Self-pay | Admitting: Obstetrics & Gynecology

## 2011-10-15 ENCOUNTER — Ambulatory Visit (INDEPENDENT_AMBULATORY_CARE_PROVIDER_SITE_OTHER): Payer: PRIVATE HEALTH INSURANCE | Admitting: Obstetrics & Gynecology

## 2011-10-15 VITALS — BP 116/71 | Wt 148.0 lb

## 2011-10-15 DIAGNOSIS — O0990 Supervision of high risk pregnancy, unspecified, unspecified trimester: Secondary | ICD-10-CM

## 2011-10-15 DIAGNOSIS — D6859 Other primary thrombophilia: Secondary | ICD-10-CM

## 2011-10-15 DIAGNOSIS — O99119 Other diseases of the blood and blood-forming organs and certain disorders involving the immune mechanism complicating pregnancy, unspecified trimester: Secondary | ICD-10-CM

## 2011-10-15 NOTE — Progress Notes (Signed)
Routine visit. No problems. NST reactive. She denies VB, ROM, CTXs. Twice weekly testing (here on Mondays and MFM on Thursdays)

## 2011-10-18 ENCOUNTER — Ambulatory Visit (HOSPITAL_COMMUNITY)
Admission: RE | Admit: 2011-10-18 | Discharge: 2011-10-18 | Disposition: A | Discharge status: Home / Self Care | Payer: PRIVATE HEALTH INSURANCE | Source: Ambulatory Visit | Attending: Family Medicine | Admitting: Family Medicine

## 2011-10-18 DIAGNOSIS — D689 Coagulation defect, unspecified: Secondary | ICD-10-CM | POA: Insufficient documentation

## 2011-10-18 DIAGNOSIS — O99119 Other diseases of the blood and blood-forming organs and certain disorders involving the immune mechanism complicating pregnancy, unspecified trimester: Secondary | ICD-10-CM | POA: Insufficient documentation

## 2011-10-18 DIAGNOSIS — O2620 Pregnancy care for patient with recurrent pregnancy loss, unspecified trimester: Secondary | ICD-10-CM | POA: Insufficient documentation

## 2011-10-22 ENCOUNTER — Encounter: Payer: PRIVATE HEALTH INSURANCE | Admitting: Obstetrics and Gynecology

## 2011-10-23 ENCOUNTER — Ambulatory Visit (INDEPENDENT_AMBULATORY_CARE_PROVIDER_SITE_OTHER): Payer: PRIVATE HEALTH INSURANCE | Admitting: Obstetrics & Gynecology

## 2011-10-23 VITALS — BP 99/76 | Wt 150.0 lb

## 2011-10-23 DIAGNOSIS — O99119 Other diseases of the blood and blood-forming organs and certain disorders involving the immune mechanism complicating pregnancy, unspecified trimester: Secondary | ICD-10-CM

## 2011-10-23 DIAGNOSIS — O0990 Supervision of high risk pregnancy, unspecified, unspecified trimester: Secondary | ICD-10-CM

## 2011-10-23 DIAGNOSIS — D6859 Other primary thrombophilia: Secondary | ICD-10-CM

## 2011-10-23 NOTE — Progress Notes (Signed)
NST reactive today, baseline in 140s.  No other complaints or concerns.  Fetal movement and labor precautions reviewed. Continue Lovenox, switch to Heparin at 36 weeks as per MFM.  Continue 2x/week testing as scheduled.

## 2011-10-24 ENCOUNTER — Encounter: Payer: PRIVATE HEALTH INSURANCE | Admitting: Family Medicine

## 2011-10-25 ENCOUNTER — Inpatient Hospital Stay (HOSPITAL_COMMUNITY): Admission: RE | Admit: 2011-10-25 | Payer: PRIVATE HEALTH INSURANCE | Source: Ambulatory Visit

## 2011-10-26 ENCOUNTER — Ambulatory Visit (HOSPITAL_COMMUNITY)
Admission: RE | Admit: 2011-10-26 | Discharge: 2011-10-26 | Disposition: A | Discharge status: Home / Self Care | Payer: PRIVATE HEALTH INSURANCE | Source: Ambulatory Visit | Attending: Family Medicine | Admitting: Family Medicine

## 2011-10-26 DIAGNOSIS — O2620 Pregnancy care for patient with recurrent pregnancy loss, unspecified trimester: Secondary | ICD-10-CM | POA: Insufficient documentation

## 2011-10-26 DIAGNOSIS — D6859 Other primary thrombophilia: Secondary | ICD-10-CM | POA: Insufficient documentation

## 2011-10-26 DIAGNOSIS — D689 Coagulation defect, unspecified: Secondary | ICD-10-CM | POA: Insufficient documentation

## 2011-10-30 ENCOUNTER — Ambulatory Visit (INDEPENDENT_AMBULATORY_CARE_PROVIDER_SITE_OTHER): Payer: PRIVATE HEALTH INSURANCE | Admitting: Obstetrics & Gynecology

## 2011-10-30 VITALS — BP 113/82 | Wt 150.0 lb

## 2011-10-30 DIAGNOSIS — D6859 Other primary thrombophilia: Secondary | ICD-10-CM

## 2011-10-30 DIAGNOSIS — Z3480 Encounter for supervision of other normal pregnancy, unspecified trimester: Secondary | ICD-10-CM

## 2011-10-30 DIAGNOSIS — D682 Hereditary deficiency of other clotting factors: Secondary | ICD-10-CM

## 2011-10-30 DIAGNOSIS — O0990 Supervision of high risk pregnancy, unspecified, unspecified trimester: Secondary | ICD-10-CM

## 2011-10-30 DIAGNOSIS — D689 Coagulation defect, unspecified: Secondary | ICD-10-CM

## 2011-10-30 NOTE — Progress Notes (Signed)
Having some irregular contractions, feels like baby has dropped, stomach appears smaller and she is having increased pelvic pressure.

## 2011-10-30 NOTE — Patient Instructions (Signed)
Return to clinic for any obstetric concerns or go to [facility] for evaluation  

## 2011-10-30 NOTE — Progress Notes (Signed)
NST reactive, baseline in the 150s, rare contractions. No other complaints or concerns. Fetal movement and labor precautions reviewed. Continue Lovenox, switch to Heparin at 36 weeks as per MFM. Continue 2x/week testing as scheduled.

## 2011-11-01 ENCOUNTER — Inpatient Hospital Stay (HOSPITAL_COMMUNITY): Admission: RE | Admit: 2011-11-01 | Payer: PRIVATE HEALTH INSURANCE | Source: Ambulatory Visit

## 2011-11-02 ENCOUNTER — Ambulatory Visit (HOSPITAL_COMMUNITY)
Admission: RE | Admit: 2011-11-02 | Discharge: 2011-11-02 | Disposition: A | Discharge status: Home / Self Care | Payer: PRIVATE HEALTH INSURANCE | Source: Ambulatory Visit | Attending: Family Medicine | Admitting: Family Medicine

## 2011-11-02 DIAGNOSIS — O99119 Other diseases of the blood and blood-forming organs and certain disorders involving the immune mechanism complicating pregnancy, unspecified trimester: Secondary | ICD-10-CM | POA: Insufficient documentation

## 2011-11-02 DIAGNOSIS — D689 Coagulation defect, unspecified: Secondary | ICD-10-CM | POA: Insufficient documentation

## 2011-11-02 DIAGNOSIS — D6859 Other primary thrombophilia: Secondary | ICD-10-CM | POA: Insufficient documentation

## 2011-11-02 DIAGNOSIS — O2620 Pregnancy care for patient with recurrent pregnancy loss, unspecified trimester: Secondary | ICD-10-CM | POA: Insufficient documentation

## 2011-11-06 ENCOUNTER — Ambulatory Visit (INDEPENDENT_AMBULATORY_CARE_PROVIDER_SITE_OTHER): Payer: PRIVATE HEALTH INSURANCE | Admitting: Obstetrics & Gynecology

## 2011-11-06 ENCOUNTER — Encounter: Payer: Self-pay | Admitting: Obstetrics & Gynecology

## 2011-11-06 VITALS — BP 118/79 | Wt 150.0 lb

## 2011-11-06 DIAGNOSIS — O0990 Supervision of high risk pregnancy, unspecified, unspecified trimester: Secondary | ICD-10-CM

## 2011-11-06 DIAGNOSIS — D689 Coagulation defect, unspecified: Secondary | ICD-10-CM

## 2011-11-06 DIAGNOSIS — D6859 Other primary thrombophilia: Secondary | ICD-10-CM

## 2011-11-06 DIAGNOSIS — Z3480 Encounter for supervision of other normal pregnancy, unspecified trimester: Secondary | ICD-10-CM

## 2011-11-06 MED ORDER — HEPARIN SODIUM (PORCINE) 10000 UNIT/ML IJ SOLN: INTRAMUSCULAR | Status: DC

## 2011-11-06 NOTE — Progress Notes (Signed)
Routine visit. She has MFM growth u/s scheduled for this Friday. She will change from Lovenox to Heparin 10,000 untils TID at 36 weeks, Check Pt/PTT 3 days after starting the heparin. Check CBC a week after starting. Want PTT to be 1/5-2 times normal. ( I spoke with Dr. Harlon Flor today). She reports good FM, denies OB problems. NST reactive today. Cultures at next visit as well at PT/PTT at 10:00 am.

## 2011-11-08 ENCOUNTER — Ambulatory Visit (INDEPENDENT_AMBULATORY_CARE_PROVIDER_SITE_OTHER): Payer: PRIVATE HEALTH INSURANCE | Admitting: Obstetrics & Gynecology

## 2011-11-08 ENCOUNTER — Inpatient Hospital Stay (HOSPITAL_COMMUNITY): Admission: RE | Admit: 2011-11-08 | Payer: PRIVATE HEALTH INSURANCE | Source: Ambulatory Visit

## 2011-11-08 VITALS — BP 117/96 | Wt 150.0 lb

## 2011-11-08 DIAGNOSIS — D689 Coagulation defect, unspecified: Secondary | ICD-10-CM

## 2011-11-08 DIAGNOSIS — O0990 Supervision of high risk pregnancy, unspecified, unspecified trimester: Secondary | ICD-10-CM

## 2011-11-08 DIAGNOSIS — Z3480 Encounter for supervision of other normal pregnancy, unspecified trimester: Secondary | ICD-10-CM

## 2011-11-08 DIAGNOSIS — D6859 Other primary thrombophilia: Secondary | ICD-10-CM

## 2011-11-08 LAB — OB RESULTS CONSOLE GBS: GBS: NEGATIVE

## 2011-11-09 ENCOUNTER — Ambulatory Visit (HOSPITAL_COMMUNITY)
Admission: RE | Admit: 2011-11-09 | Discharge: 2011-11-09 | Disposition: A | Discharge status: Home / Self Care | Payer: PRIVATE HEALTH INSURANCE | Source: Ambulatory Visit | Attending: Family Medicine | Admitting: Family Medicine

## 2011-11-09 VITALS — BP 123/73 | HR 112 | Wt 150.2 lb

## 2011-11-09 DIAGNOSIS — O2620 Pregnancy care for patient with recurrent pregnancy loss, unspecified trimester: Secondary | ICD-10-CM | POA: Insufficient documentation

## 2011-11-09 DIAGNOSIS — D6859 Other primary thrombophilia: Secondary | ICD-10-CM

## 2011-11-09 NOTE — Progress Notes (Signed)
Regina Butler  was seen today for an ultrasound appointment.  See full report in [facility].  Alpha Gula, MD  Regina Butler returns for follow up growth and BPP.  She is currnently followed due to hx of  TIA.  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 She has never had  venous thromboembolism.  Plan is to transition her to unfractionated Heparin today (to begin 10,000 units TID)  Single IUP at 35 5/7 weeks Interval growth is appropriate (51st %tile) Normal amniotic fluid volume Active fetus - BPP of 8/8 noted  1) Continue twice weekly testing. 2) Recommend checking PT/PTT 4 hours after AM dose in 72 hours- adjust Heparin dose to acheive PTT  of 1.5 to 2 x normal value. 3) Check CBC to screen for thrombocytopenia after approximately one week of therapy. 4) Recommend scheduled induction at 39-[redacted] weeks gestation- hold Heparin x 24 hours prior to scheduled induction 5) Resume Lovinox at previous dose 12 hours after SVD (24 hours if cesarean section performed)

## 2011-11-09 NOTE — Progress Notes (Signed)
Here today for evaluation of back pain and spotting. Cervical exam was 1.5/50/-3/soft/mid/vertex. No LOF, no bleeding. Given concern about possible labor soon, patient was advised to start Heparin in lien of Lovenox, she will start this tomorrow 11/09/11 and return her on 11/12/11 for labs.  Continue twice a week testing, scheduled to go to [facility] tomorrow.  No other complaints or concerns.  Fetal movement and labor precautions reviewed.

## 2011-11-09 NOTE — Patient Instructions (Signed)
Return to clinic for any obstetric concerns or go to [facility] for evaluation  

## 2011-11-11 LAB — CULTURE, BETA STREP (GROUP B ONLY)

## 2011-11-12 ENCOUNTER — Encounter: Payer: Self-pay | Admitting: Obstetrics & Gynecology

## 2011-11-13 ENCOUNTER — Ambulatory Visit (INDEPENDENT_AMBULATORY_CARE_PROVIDER_SITE_OTHER): Payer: PRIVATE HEALTH INSURANCE | Admitting: Family Medicine

## 2011-11-13 VITALS — BP 104/73 | Wt 151.2 lb

## 2011-11-13 DIAGNOSIS — D6859 Other primary thrombophilia: Secondary | ICD-10-CM

## 2011-11-13 DIAGNOSIS — O99119 Other diseases of the blood and blood-forming organs and certain disorders involving the immune mechanism complicating pregnancy, unspecified trimester: Secondary | ICD-10-CM

## 2011-11-13 DIAGNOSIS — Z3480 Encounter for supervision of other normal pregnancy, unspecified trimester: Secondary | ICD-10-CM

## 2011-11-13 DIAGNOSIS — D689 Coagulation defect, unspecified: Secondary | ICD-10-CM

## 2011-11-13 NOTE — Progress Notes (Signed)
U/S shows vtx 5 12 oz 51%.  Per MFM-->change to Heparin, however, on discussion with pt., pharmacy advised she not change to heparin, would require 2 injections per dose, there is a preservative that they do not recommend.  There is also a lot of monitoring with Heparin.  Pt. Will remain on Lovenox.  No cervical change. NST reviewed and reactive.

## 2011-11-13 NOTE — Patient Instructions (Signed)
Contraception Choices Contraception (birth control) is the use of any methods or devices to prevent pregnancy. Below are some methods to help avoid pregnancy. HORMONAL METHODS   Contraceptive implant. This is a thin, plastic tube containing progesterone hormone. It does not contain estrogen hormone. Your caregiver inserts the tube in the inner part of the upper arm. The tube can remain in place for up to 3 years. After 3 years, the implant must be removed. The implant prevents the ovaries from releasing an egg (ovulation), thickens the cervical mucus which prevents sperm from entering the uterus, and thins the lining of the inside of the uterus.  Progesterone-only injections. These injections are given every 3 months by your caregiver to prevent pregnancy. This synthetic progesterone hormone stops the ovaries from releasing eggs. It also thickens cervical mucus and changes the uterine lining. This makes it harder for sperm to survive in the uterus.  Birth control pills. These pills contain estrogen and progesterone hormone. They work by stopping the egg from forming in the ovary (ovulation). Birth control pills are prescribed by a caregiver.Birth control pills can also be used to treat heavy periods.  Minipill. This type of birth control pill contains only the progesterone hormone. They are taken every day of each month and must be prescribed by your caregiver.  Birth control patch. The patch contains hormones similar to those in birth control pills. It must be changed once a week and is prescribed by a caregiver.  Vaginal ring. The ring contains hormones similar to those in birth control pills. It is left in the vagina for 3 weeks, removed for 1 week, and then a new one is put back in place. The patient must be comfortable inserting and removing the ring from the vagina.A caregiver's prescription is necessary.  Emergency contraception. Emergency contraceptives prevent pregnancy after unprotected  sexual intercourse. This pill can be taken right after sex or up to 5 days after unprotected sex. It is most effective the sooner you take the pills after having sexual intercourse. Emergency contraceptive pills are available without a prescription. Check with your pharmacist. Do not use emergency contraception as your only form of birth control. BARRIER METHODS   Female condom. This is a thin sheath (latex or rubber) that is worn over the penis during sexual intercourse. It can be used with spermicide to increase effectiveness.  Female condom. This is a soft, loose-fitting sheath that is put into the vagina before sexual intercourse.  Diaphragm. This is a soft, latex, dome-shaped barrier that must be fitted by a caregiver. It is inserted into the vagina, along with a spermicidal jelly. It is inserted before intercourse. The diaphragm should be left in the vagina for 6 to 8 hours after intercourse.  Cervical cap. This is a round, soft, latex or plastic cup that fits over the cervix and must be fitted by a caregiver. The cap can be left in place for up to 48 hours after intercourse.  Sponge. This is a soft, circular piece of polyurethane foam. The sponge has spermicide in it. It is inserted into the vagina after wetting it and before sexual intercourse.  Spermicides. These are chemicals that kill or block sperm from entering the cervix and uterus. They come in the form of creams, jellies, suppositories, foam, or tablets. They do not require a prescription. They are inserted into the vagina with an applicator before having sexual intercourse. The process must be repeated every time you have sexual intercourse. INTRAUTERINE CONTRACEPTION  Intrauterine device (  IUD). This is a T-shaped device that is put in a woman's uterus during a menstrual period to prevent pregnancy. There are 2 types:  Copper IUD. This type of IUD is wrapped in copper wire and is placed inside the uterus. Copper makes the uterus and  fallopian tubes produce a fluid that kills sperm. It can stay in place for 10 years.  Hormone IUD. This type of IUD contains the hormone progestin (synthetic progesterone). The hormone thickens the cervical mucus and prevents sperm from entering the uterus, and it also thins the uterine lining to prevent implantation of a fertilized egg. The hormone can weaken or kill the sperm that get into the uterus. It can stay in place for 5 years. PERMANENT METHODS OF CONTRACEPTION  Female tubal ligation. This is when the woman's fallopian tubes are surgically sealed, tied, or blocked to prevent the egg from traveling to the uterus.  Female sterilization. This is when the female has the tubes that carry sperm tied off (vasectomy).This blocks sperm from entering the vagina during sexual intercourse. After the procedure, the man can still ejaculate fluid (semen). NATURAL PLANNING METHODS  Natural family planning. This is not having sexual intercourse or using a barrier method (condom, diaphragm, cervical cap) on days the woman could become pregnant.  Calendar method. This is keeping track of the length of each menstrual cycle and identifying when you are fertile.  Ovulation method. This is avoiding sexual intercourse during ovulation.  Symptothermal method. This is avoiding sexual intercourse during ovulation, using a thermometer and ovulation symptoms.  Post-ovulation method. This is timing sexual intercourse after you have ovulated. Regardless of which type or method of contraception you choose, it is important that you use condoms to protect against the transmission of sexually transmitted diseases (STDs). Talk with your caregiver about which form of contraception is most appropriate for you. Document Released: 01/22/2005 Document Revised: 04/16/2011 Document Reviewed: 05/31/2010 [employer] Patient Information 2013 [employer]. Breastfeeding Deciding to breastfeed is one of the best choices you can make  for you and your baby. The information that follows gives a brief overview of the benefits of breastfeeding as well as common topics surrounding breastfeeding. BENEFITS OF BREASTFEEDING For the baby  The first milk (colostrum) helps the baby's digestive system function better.   There are antibodies in the mother's milk that help the baby fight off infections.   The baby has a lower incidence of asthma, allergies, and sudden infant death syndrome (SIDS).   The nutrients in breast milk are better for the baby than infant formulas, and breast milk helps the baby's brain grow better.   Babies who breastfeed have less gas, colic, and constipation.  For the mother  Breastfeeding helps develop a very special bond between the mother and her baby.   Breastfeeding is convenient, always available at the correct temperature, and costs nothing.   Breastfeeding burns calories in the mother and helps her lose weight that was gained during pregnancy.   Breastfeeding makes the uterus contract back down to normal size faster and slows bleeding following delivery.   Breastfeeding mothers have a lower risk of developing breast cancer.  BREASTFEEDING FREQUENCY  A healthy, full-term baby may breastfeed as often as every hour or space his or her feedings to every 3 hours.   Watch your baby for signs of hunger. Nurse your baby if he or she shows signs of hunger. How often you nurse will vary from baby to baby.   Nurse as often as   the baby requests, or when you feel the need to reduce the fullness of your breasts.   Awaken the baby if it has been 3 4 hours since the last feeding.   Frequent feeding will help the mother make more milk and will help prevent problems, such as sore nipples and engorgement of the breasts.  BABY'S POSITION AT THE BREAST  Whether lying down or sitting, be sure that the baby's tummy is facing your tummy.   Support the breast with 4 fingers underneath the  breast and the thumb above. Make sure your fingers are well away from the nipple and baby's mouth.   Stroke the baby's lips gently with your finger or nipple.   When the baby's mouth is open wide enough, place all of your nipple and as much of the areola as possible into your baby's mouth.   Pull the baby in close so the tip of the nose and the baby's cheeks touch the breast during the feeding.  FEEDINGS AND SUCTION  The length of each feeding varies from baby to baby and from feeding to feeding.   The baby must suck about 2 3 minutes for your milk to get to him or her. This is called a "let down." For this reason, allow the baby to feed on each breast as long as he or she wants. Your baby will end the feeding when he or she has received the right balance of nutrients.   To break the suction, put your finger into the corner of the baby's mouth and slide it between his or her gums before removing your breast from his or her mouth. This will help prevent sore nipples.  HOW TO TELL WHETHER YOUR BABY IS GETTING ENOUGH BREAST MILK. Wondering whether or not your baby is getting enough milk is a common concern among mothers. You can be assured that your baby is getting enough milk if:   Your baby is actively sucking and you hear swallowing.   Your baby seems relaxed and satisfied after a feeding.   Your baby nurses at least 8 12 times in a 24 hour time period. Nurse your baby until he or she unlatches or falls asleep at the first breast (at least 10 20 minutes), then offer the second side.   Your baby is wetting 5 6 disposable diapers (6 8 cloth diapers) in a 24 hour period by 5 6 days of age.   Your baby is having at least 3 4 stools every 24 hours for the first 6 weeks. The stool should be soft and yellow.   Your baby should gain 4 7 ounces per week after he or she is 4 days old.   Your breasts feel softer after nursing.  REDUCING BREAST ENGORGEMENT  In the first week after  your baby is born, you may experience signs of breast engorgement. When breasts are engorged, they feel heavy, warm, full, and may be tender to the touch. You can reduce engorgement if you:   Nurse frequently, every 2 3 hours. Mothers who breastfeed early and often have fewer problems with engorgement.   Place light ice packs on your breasts for 10 20 minutes between feedings. This reduces swelling. Wrap the ice packs in a lightweight towel to protect your skin. Bags of frozen vegetables work well for this purpose.   Take a warm shower or apply warm, moist heat to your breast for 5 10 minutes just before each feeding. This increases circulation and helps the   milk flow.   Gently massage your breast before and during the feeding. Using your finger tips, massage from the chest wall towards your nipple in a circular motion.   Make sure that the baby empties at least one breast at every feeding before switching sides.   Use a breast pump to empty the breasts if your baby is sleepy or not nursing well. You may also want to pump if you are returning to work oryou feel you are getting engorged.   Avoid bottle feeds, pacifiers, or supplemental feedings of water or juice in place of breastfeeding. Breast milk is all the food your baby needs. It is not necessary for your baby to have water or formula. In fact, to help your breasts make more milk, it is best not to give your baby supplemental feedings during the early weeks.   Be sure the baby is latched on and positioned properly while breastfeeding.   Wear a supportive bra, avoiding underwire styles.   Eat a balanced diet with enough fluids.   Rest often, relax, and take your prenatal vitamins to prevent fatigue, stress, and anemia.  If you follow these suggestions, your engorgement should improve in 24 48 hours. If you are still experiencing difficulty, call your lactation consultant or caregiver.  CARING FOR YOURSELF Take care of your  breasts  Bathe or shower daily.   Avoid using soap on your nipples.   Start feedings on your left breast at one feeding and on your right breast at the next feeding.   You will notice an increase in your milk supply 2 5 days after delivery. You may feel some discomfort from engorgement, which makes your breasts very firm and often tender. Engorgement "peaks" out within 24 48 hours. In the meantime, apply warm moist towels to your breasts for 5 10 minutes before feeding. Gentle massage and expression of some milk before feeding will soften your breasts, making it easier for your baby to latch on.   Wear a well-fitting nursing bra, and air dry your nipples for a 3 4minutes after each feeding.   Only use cotton bra pads.   Only use pure lanolin on your nipples after nursing. You do not need to wash it off before feeding the baby again. Another option is to express a few drops of breast milk and gently massage it into your nipples.  Take care of yourself  Eat well-balanced meals and nutritious snacks.   Drinking milk, fruit juice, and water to satisfy your thirst (about 8 glasses a day).   Get plenty of rest.  Avoid foods that you notice affect the baby in a bad way.  SEEK MEDICAL CARE IF:   You have difficulty with breastfeeding and need help.   You have a hard, red, sore area on your breast that is accompanied by a fever.   Your baby is too sleepy to eat well or is having trouble sleeping.   Your baby is wetting less than 6 diapers a day, by 5 days of age.   Your baby's skin or white part of his or her eyes is more yellow than it was in the hospital.   You feel depressed.  Document Released: 01/22/2005 Document Revised: 07/24/2011 Document Reviewed: 04/22/2011 [employer] Patient Information 2013 [employer].  

## 2011-11-14 ENCOUNTER — Other Ambulatory Visit: Payer: Self-pay | Admitting: Family Medicine

## 2011-11-14 DIAGNOSIS — O2620 Pregnancy care for patient with recurrent pregnancy loss, unspecified trimester: Secondary | ICD-10-CM

## 2011-11-16 ENCOUNTER — Ambulatory Visit (HOSPITAL_COMMUNITY)
Admission: RE | Admit: 2011-11-16 | Discharge: 2011-11-16 | Disposition: A | Discharge status: Home / Self Care | Payer: PRIVATE HEALTH INSURANCE | Source: Ambulatory Visit | Attending: Family Medicine | Admitting: Family Medicine

## 2011-11-16 VITALS — BP 126/82 | HR 126 | Wt 153.0 lb

## 2011-11-16 DIAGNOSIS — O99119 Other diseases of the blood and blood-forming organs and certain disorders involving the immune mechanism complicating pregnancy, unspecified trimester: Secondary | ICD-10-CM

## 2011-11-16 DIAGNOSIS — O99891 Other specified diseases and conditions complicating pregnancy: Secondary | ICD-10-CM | POA: Insufficient documentation

## 2011-11-16 DIAGNOSIS — O2620 Pregnancy care for patient with recurrent pregnancy loss, unspecified trimester: Secondary | ICD-10-CM | POA: Insufficient documentation

## 2011-11-16 DIAGNOSIS — Q796 Ehlers-Danlos syndrome, unspecified: Secondary | ICD-10-CM | POA: Insufficient documentation

## 2011-11-16 NOTE — Addendum Note (Signed)
Encounter addended by: Duanne Moron, RN on: 11/16/2011 11:26 AM<BR>     Documentation filed: Charges VN

## 2011-11-16 NOTE — Progress Notes (Signed)
Regina Butler  was seen today for an ultrasound appointment.  See full report in [facility].  Alpha Gula, MD  Regina Butler returns for follow up growth and BPP.  She is currnently followed due to hx of  TIA.  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 She has never had  venous thromboembolism.  Plan was to transition from full dose anticoagulation on Lovinox to unfractionated Heparin, but there have been some issues with the pharmacy and she has not been able to do so yet.  Single IUP at 36 5/7 weeks Active fetus with BPP of 8/8 Normal amniotic fluid volume  1) Continue twice weekly testing. 2) Continue Lovinox at present dose until able to work out issues with pharmacy.  We reviewed risks/ benefits of continuing Lovinox vs. transitioning to Heparin.  She is aware that she would need to be off Lovinox for 24 hours before she would be a candidate for Epidural / Spinal anesthesia in labor.  Although this risk can be mitigated by a scheduled induction, this would still be an issue if she were to present in labor.

## 2011-11-20 ENCOUNTER — Encounter: Payer: Self-pay | Admitting: Obstetrics & Gynecology

## 2011-11-20 ENCOUNTER — Ambulatory Visit (INDEPENDENT_AMBULATORY_CARE_PROVIDER_SITE_OTHER): Payer: PRIVATE HEALTH INSURANCE | Admitting: Obstetrics & Gynecology

## 2011-11-20 VITALS — BP 110/70 | Wt 150.0 lb

## 2011-11-20 DIAGNOSIS — O99119 Other diseases of the blood and blood-forming organs and certain disorders involving the immune mechanism complicating pregnancy, unspecified trimester: Secondary | ICD-10-CM

## 2011-11-20 DIAGNOSIS — O0990 Supervision of high risk pregnancy, unspecified, unspecified trimester: Secondary | ICD-10-CM

## 2011-11-20 DIAGNOSIS — D6859 Other primary thrombophilia: Secondary | ICD-10-CM

## 2011-11-20 DIAGNOSIS — D689 Coagulation defect, unspecified: Secondary | ICD-10-CM

## 2011-11-20 DIAGNOSIS — Z3480 Encounter for supervision of other normal pregnancy, unspecified trimester: Secondary | ICD-10-CM

## 2011-11-20 NOTE — Progress Notes (Signed)
Routine visit. NST reactive. Good FM. She will switch to heparin today. She has not had her Lovenox yet today. Labor precautions reviewed.

## 2011-11-22 ENCOUNTER — Other Ambulatory Visit: Payer: Self-pay | Admitting: Obstetrics & Gynecology

## 2011-11-22 ENCOUNTER — Telehealth: Payer: Self-pay | Admitting: *Deleted

## 2011-11-22 DIAGNOSIS — D6859 Other primary thrombophilia: Secondary | ICD-10-CM

## 2011-11-22 NOTE — Telephone Encounter (Signed)
Patient would like to be seen to check to see if she has dilated any further.

## 2011-11-23 ENCOUNTER — Ambulatory Visit (INDEPENDENT_AMBULATORY_CARE_PROVIDER_SITE_OTHER): Payer: PRIVATE HEALTH INSURANCE | Admitting: Obstetrics & Gynecology

## 2011-11-23 ENCOUNTER — Other Ambulatory Visit: Payer: Self-pay | Admitting: Obstetrics & Gynecology

## 2011-11-23 ENCOUNTER — Encounter: Payer: Self-pay | Admitting: Obstetrics & Gynecology

## 2011-11-23 ENCOUNTER — Ambulatory Visit (HOSPITAL_COMMUNITY)
Admission: RE | Admit: 2011-11-23 | Discharge: 2011-11-23 | Disposition: A | Discharge status: Home / Self Care | Payer: PRIVATE HEALTH INSURANCE | Source: Ambulatory Visit | Attending: Obstetrics & Gynecology | Admitting: Obstetrics & Gynecology

## 2011-11-23 VITALS — BP 134/82 | Wt 150.0 lb

## 2011-11-23 VITALS — BP 117/73 | HR 108 | Wt 150.0 lb

## 2011-11-23 DIAGNOSIS — O2620 Pregnancy care for patient with recurrent pregnancy loss, unspecified trimester: Secondary | ICD-10-CM | POA: Insufficient documentation

## 2011-11-23 DIAGNOSIS — D6859 Other primary thrombophilia: Secondary | ICD-10-CM

## 2011-11-23 DIAGNOSIS — O0990 Supervision of high risk pregnancy, unspecified, unspecified trimester: Secondary | ICD-10-CM

## 2011-11-23 DIAGNOSIS — O99119 Other diseases of the blood and blood-forming organs and certain disorders involving the immune mechanism complicating pregnancy, unspecified trimester: Secondary | ICD-10-CM

## 2011-11-23 DIAGNOSIS — D689 Coagulation defect, unspecified: Secondary | ICD-10-CM | POA: Insufficient documentation

## 2011-11-23 DIAGNOSIS — I6350 Cerebral infarction due to unspecified occlusion or stenosis of unspecified cerebral artery: Secondary | ICD-10-CM

## 2011-11-23 DIAGNOSIS — Z3480 Encounter for supervision of other normal pregnancy, unspecified trimester: Secondary | ICD-10-CM

## 2011-11-23 DIAGNOSIS — D682 Hereditary deficiency of other clotting factors: Secondary | ICD-10-CM

## 2011-11-23 NOTE — Progress Notes (Signed)
[facility]  Indication: 26 yr old [id number] at [redacted]w[redacted]d with history of cerebrovascular accident, factor V leiden mutation, and protein C and S deficiency (diagnosed in pregnancy) for biophysical profile.  Findings: 1. Single intrauterine pregnancy. 2. Anterior placenta without evidence of previa. 3. Normal amniotic fluid volume. 4. Normal biophysical profile of 8/8.  Recommendations: 1. Coagulation defect: - previously counseled - has been on lovenox but stopped over the past few days because she had her membranes were stripped by her primary OB and she thought she would go into labor - discussed it is important for her to restart her anticoagulation given her history- prescription given for heparin 10,000 units TID - patient is to get PTT on Monday would also recommend CBC to evaluate platelet levels - instructed patient to hold dose if she feel she is going into labor or if membranes break 2. Recommend consider induction at [redacted] weeks gestation and hold heparin for 12 hours prior to induction 3. recommend restart lovenox after delivery as previously recommended 4. Recommend continue antenatal testing  Eulis Foster, MD

## 2011-11-23 NOTE — Progress Notes (Signed)
Routine visit. No problems. Good FM. NST reactive. She will go to MFM today to discuss her heparin dose and have a BPP. Labor precautions. Membranes stripped.

## 2011-11-27 ENCOUNTER — Ambulatory Visit (INDEPENDENT_AMBULATORY_CARE_PROVIDER_SITE_OTHER): Payer: PRIVATE HEALTH INSURANCE | Admitting: Obstetrics & Gynecology

## 2011-11-27 ENCOUNTER — Encounter: Payer: Self-pay | Admitting: Obstetrics & Gynecology

## 2011-11-27 ENCOUNTER — Ambulatory Visit (HOSPITAL_COMMUNITY)
Admission: RE | Admit: 2011-11-27 | Discharge: 2011-11-27 | Disposition: A | Discharge status: Home / Self Care | Payer: PRIVATE HEALTH INSURANCE | Source: Ambulatory Visit | Attending: Obstetrics & Gynecology | Admitting: Obstetrics & Gynecology

## 2011-11-27 ENCOUNTER — Telehealth (HOSPITAL_COMMUNITY): Payer: Self-pay | Admitting: *Deleted

## 2011-11-27 ENCOUNTER — Encounter (HOSPITAL_COMMUNITY): Payer: Self-pay | Admitting: *Deleted

## 2011-11-27 ENCOUNTER — Other Ambulatory Visit: Payer: Self-pay | Admitting: Obstetrics & Gynecology

## 2011-11-27 VITALS — BP 125/80 | Wt 151.0 lb

## 2011-11-27 DIAGNOSIS — D689 Coagulation defect, unspecified: Secondary | ICD-10-CM

## 2011-11-27 DIAGNOSIS — O0990 Supervision of high risk pregnancy, unspecified, unspecified trimester: Secondary | ICD-10-CM

## 2011-11-27 DIAGNOSIS — O2620 Pregnancy care for patient with recurrent pregnancy loss, unspecified trimester: Secondary | ICD-10-CM | POA: Insufficient documentation

## 2011-11-27 DIAGNOSIS — O289 Unspecified abnormal findings on antenatal screening of mother: Secondary | ICD-10-CM

## 2011-11-27 DIAGNOSIS — O288 Other abnormal findings on antenatal screening of mother: Secondary | ICD-10-CM

## 2011-11-27 DIAGNOSIS — O368190 Decreased fetal movements, unspecified trimester, not applicable or unspecified: Secondary | ICD-10-CM | POA: Insufficient documentation

## 2011-11-27 DIAGNOSIS — D6859 Other primary thrombophilia: Secondary | ICD-10-CM

## 2011-11-27 DIAGNOSIS — Z3480 Encounter for supervision of other normal pregnancy, unspecified trimester: Secondary | ICD-10-CM

## 2011-11-27 NOTE — Telephone Encounter (Signed)
Preadmission screen  

## 2011-11-27 NOTE — Progress Notes (Signed)
Routine visit. Routine visit. Schedule IOL at [redacted] weeks EGA- 12/02/11 at 0730 (I spoke with Dr. Harlon Flor today). She will stop her heparin the day before IOL. Good FM. No OB problems. NST-non reactive. She will have a BPP today at [facility]. The results should be called to me.

## 2011-11-30 ENCOUNTER — Ambulatory Visit (HOSPITAL_COMMUNITY): Admission: RE | Admit: 2011-11-30 | Payer: PRIVATE HEALTH INSURANCE | Source: Ambulatory Visit

## 2011-12-02 ENCOUNTER — Inpatient Hospital Stay (HOSPITAL_COMMUNITY): Admission: RE | Admit: 2011-12-02 | Payer: PRIVATE HEALTH INSURANCE | Source: Ambulatory Visit

## 2011-12-02 ENCOUNTER — Inpatient Hospital Stay (HOSPITAL_COMMUNITY)
Admission: AD | Admit: 2011-12-02 | Discharge: 2011-12-04 | DRG: 775 | Disposition: A | Discharge status: Home / Self Care | Payer: PRIVATE HEALTH INSURANCE | Source: Ambulatory Visit | Attending: Obstetrics & Gynecology | Admitting: Obstetrics & Gynecology

## 2011-12-02 ENCOUNTER — Encounter (HOSPITAL_COMMUNITY): Payer: Self-pay | Admitting: Anesthesiology

## 2011-12-02 ENCOUNTER — Encounter (HOSPITAL_COMMUNITY): Payer: Self-pay | Admitting: *Deleted

## 2011-12-02 ENCOUNTER — Inpatient Hospital Stay (HOSPITAL_COMMUNITY): Payer: PRIVATE HEALTH INSURANCE | Admitting: Anesthesiology

## 2011-12-02 DIAGNOSIS — O4290 Premature rupture of membranes, unspecified as to length of time between rupture and onset of labor, unspecified weeks of gestation: Secondary | ICD-10-CM

## 2011-12-02 DIAGNOSIS — D6859 Other primary thrombophilia: Secondary | ICD-10-CM

## 2011-12-02 DIAGNOSIS — D689 Coagulation defect, unspecified: Secondary | ICD-10-CM | POA: Diagnosis present

## 2011-12-02 DIAGNOSIS — O9912 Other diseases of the blood and blood-forming organs and certain disorders involving the immune mechanism complicating childbirth: Secondary | ICD-10-CM

## 2011-12-02 HISTORY — DX: Adverse effect of unspecified anesthetic, initial encounter: T41.45XA

## 2011-12-02 HISTORY — DX: Other complications of anesthesia, initial encounter: T88.59XA

## 2011-12-02 LAB — WET PREP, GENITAL
Trich, Wet Prep: NONE SEEN
Yeast Wet Prep HPF POC: NONE SEEN

## 2011-12-02 LAB — PROTIME-INR: INR: 0.93 (ref 0.00–1.49)

## 2011-12-02 LAB — CBC
Hemoglobin: 13.4 g/dL (ref 12.0–15.0)
MCHC: 33.8 g/dL (ref 30.0–36.0)
RDW: 12.8 % (ref 11.5–15.5)
WBC: 19.7 10*3/uL — ABNORMAL HIGH (ref 4.0–10.5)

## 2011-12-02 LAB — RPR: RPR Ser Ql: NONREACTIVE

## 2011-12-02 LAB — POCT FERN TEST: Fern Test: NEGATIVE

## 2011-12-02 MED ORDER — ONDANSETRON HCL 4 MG/2ML IJ SOLN: 4.0000 mg | Freq: Four times a day (QID) | INTRAMUSCULAR | Status: DC | PRN

## 2011-12-02 MED ORDER — LANOLIN HYDROUS EX OINT: TOPICAL_OINTMENT | CUTANEOUS | Status: DC | PRN

## 2011-12-02 MED ORDER — EPHEDRINE 5 MG/ML INJ
INTRAVENOUS | Status: AC
  Filled 2011-12-02: qty 4

## 2011-12-02 MED ORDER — OXYTOCIN 40 UNITS IN LACTATED RINGERS INFUSION - SIMPLE MED
62.5000 mL/h | INTRAVENOUS | Status: DC
  Administered 2011-12-02: 62.5 mL/h via INTRAVENOUS

## 2011-12-02 MED ORDER — CITRIC ACID-SODIUM CITRATE 334-500 MG/5ML PO SOLN: 30.0000 mL | ORAL | Status: DC | PRN

## 2011-12-02 MED ORDER — INFLUENZA VIRUS VACC SPLIT PF IM SUSP
0.5000 mL | INTRAMUSCULAR | Status: AC
  Administered 2011-12-03: 0.5 mL via INTRAMUSCULAR
  Filled 2011-12-02: qty 0.5

## 2011-12-02 MED ORDER — CALCIUM CARBONATE 1250 (500 CA) MG PO TABS
1.0000 | ORAL_TABLET | Freq: Every day | ORAL | Status: DC
  Administered 2011-12-02 – 2011-12-04 (×3): 500 mg via ORAL
  Filled 2011-12-02 (×4): qty 1

## 2011-12-02 MED ORDER — FENTANYL 2.5 MCG/ML BUPIVACAINE 1/10 % EPIDURAL INFUSION (WH - ANES)
INTRAMUSCULAR | Status: AC
  Filled 2011-12-02: qty 125

## 2011-12-02 MED ORDER — CALCIUM CARBONATE 1250 (500 CA) MG PO CHEW: 1250.0000 mg | CHEWABLE_TABLET | Freq: Every day | ORAL | Status: DC

## 2011-12-02 MED ORDER — TETANUS-DIPHTH-ACELL PERTUSSIS 5-2.5-18.5 LF-MCG/0.5 IM SUSP: 0.5000 mL | Freq: Once | INTRAMUSCULAR | Status: DC

## 2011-12-02 MED ORDER — DIBUCAINE 1 % RE OINT
1.0000 "application " | TOPICAL_OINTMENT | RECTAL | Status: DC | PRN
  Administered 2011-12-02: 1 via RECTAL
  Filled 2011-12-02: qty 28

## 2011-12-02 MED ORDER — PHENYLEPHRINE 40 MCG/ML (10ML) SYRINGE FOR IV PUSH (FOR BLOOD PRESSURE SUPPORT): 80.0000 ug | PREFILLED_SYRINGE | INTRAVENOUS | Status: DC | PRN

## 2011-12-02 MED ORDER — FERROUS SULFATE 325 (65 FE) MG PO TABS
325.0000 mg | ORAL_TABLET | Freq: Two times a day (BID) | ORAL | Status: DC
  Administered 2011-12-02 – 2011-12-04 (×4): 325 mg via ORAL
  Filled 2011-12-02 (×4): qty 1

## 2011-12-02 MED ORDER — SENNOSIDES-DOCUSATE SODIUM 8.6-50 MG PO TABS
2.0000 | ORAL_TABLET | Freq: Every day | ORAL | Status: DC
  Administered 2011-12-02: 2 via ORAL

## 2011-12-02 MED ORDER — DIPHENHYDRAMINE HCL 50 MG/ML IJ SOLN: 12.5000 mg | INTRAMUSCULAR | Status: DC | PRN

## 2011-12-02 MED ORDER — LIDOCAINE HCL (PF) 1 % IJ SOLN
INTRAMUSCULAR | Status: DC | PRN
  Administered 2011-12-02 (×4): 4 mL

## 2011-12-02 MED ORDER — PNEUMOCOCCAL VAC POLYVALENT 25 MCG/0.5ML IJ INJ
0.5000 mL | INJECTION | INTRAMUSCULAR | Status: AC
  Filled 2011-12-02: qty 0.5

## 2011-12-02 MED ORDER — PHENYLEPHRINE 40 MCG/ML (10ML) SYRINGE FOR IV PUSH (FOR BLOOD PRESSURE SUPPORT)
PREFILLED_SYRINGE | INTRAVENOUS | Status: AC
  Filled 2011-12-02: qty 5

## 2011-12-02 MED ORDER — ACETAMINOPHEN 325 MG PO TABS: 650.0000 mg | ORAL_TABLET | ORAL | Status: DC | PRN

## 2011-12-02 MED ORDER — SIMETHICONE 80 MG PO CHEW: 80.0000 mg | CHEWABLE_TABLET | ORAL | Status: DC | PRN

## 2011-12-02 MED ORDER — EPHEDRINE 5 MG/ML INJ: 10.0000 mg | INTRAVENOUS | Status: DC | PRN

## 2011-12-02 MED ORDER — LACTATED RINGERS IV SOLN
INTRAVENOUS | Status: DC
  Administered 2011-12-02 (×2): via INTRAVENOUS

## 2011-12-02 MED ORDER — LACTATED RINGERS IV SOLN
500.0000 mL | Freq: Once | INTRAVENOUS | Status: AC
  Administered 2011-12-02: 11:00:00 via INTRAVENOUS

## 2011-12-02 MED ORDER — METHYLERGONOVINE MALEATE 0.2 MG/ML IJ SOLN: 0.2000 mg | INTRAMUSCULAR | Status: DC | PRN

## 2011-12-02 MED ORDER — OXYTOCIN BOLUS FROM INFUSION
500.0000 mL | INTRAVENOUS | Status: DC
  Filled 2011-12-02 (×67): qty 500

## 2011-12-02 MED ORDER — LACTATED RINGERS IV SOLN: 500.0000 mL | INTRAVENOUS | Status: DC | PRN

## 2011-12-02 MED ORDER — OXYCODONE-ACETAMINOPHEN 5-325 MG PO TABS: 1.0000 | ORAL_TABLET | ORAL | Status: DC | PRN

## 2011-12-02 MED ORDER — ENOXAPARIN SODIUM 40 MG/0.4ML ~~LOC~~ SOLN: 40.0000 mg | SUBCUTANEOUS | Status: DC

## 2011-12-02 MED ORDER — DIPHENHYDRAMINE HCL 25 MG PO CAPS
25.0000 mg | ORAL_CAPSULE | Freq: Four times a day (QID) | ORAL | Status: DC | PRN
  Administered 2011-12-03: 25 mg via ORAL
  Filled 2011-12-02: qty 1

## 2011-12-02 MED ORDER — FENTANYL CITRATE 0.05 MG/ML IJ SOLN
50.0000 ug | INTRAMUSCULAR | Status: DC | PRN
  Administered 2011-12-02: 50 ug via INTRAVENOUS
  Filled 2011-12-02: qty 2

## 2011-12-02 MED ORDER — IBUPROFEN 600 MG PO TABS: 600.0000 mg | ORAL_TABLET | Freq: Four times a day (QID) | ORAL | Status: DC

## 2011-12-02 MED ORDER — FENTANYL 2.5 MCG/ML BUPIVACAINE 1/10 % EPIDURAL INFUSION (WH - ANES)
14.0000 mL/h | INTRAMUSCULAR | Status: DC
  Administered 2011-12-02: 14 mL/h via EPIDURAL

## 2011-12-02 MED ORDER — WITCH HAZEL-GLYCERIN EX PADS: 1.0000 "application " | MEDICATED_PAD | CUTANEOUS | Status: DC | PRN

## 2011-12-02 MED ORDER — ZOLPIDEM TARTRATE 5 MG PO TABS
5.0000 mg | ORAL_TABLET | Freq: Every evening | ORAL | Status: DC | PRN
  Filled 2011-12-02: qty 1

## 2011-12-02 MED ORDER — NALBUPHINE HCL 10 MG/ML IJ SOLN
10.0000 mg | INTRAMUSCULAR | Status: DC | PRN
  Filled 2011-12-02: qty 1

## 2011-12-02 MED ORDER — ACETAMINOPHEN 325 MG PO TABS
650.0000 mg | ORAL_TABLET | Freq: Four times a day (QID) | ORAL | Status: DC | PRN
  Administered 2011-12-03: 650 mg via ORAL
  Filled 2011-12-02: qty 2

## 2011-12-02 MED ORDER — MEASLES, MUMPS & RUBELLA VAC ~~LOC~~ INJ
0.5000 mL | INJECTION | Freq: Once | SUBCUTANEOUS | Status: DC
  Filled 2011-12-02: qty 0.5

## 2011-12-02 MED ORDER — OXYCODONE-ACETAMINOPHEN 5-325 MG PO TABS
1.0000 | ORAL_TABLET | ORAL | Status: DC | PRN
  Administered 2011-12-02: 2 via ORAL
  Administered 2011-12-02: 1 via ORAL
  Administered 2011-12-03: 2 via ORAL
  Filled 2011-12-02 (×2): qty 2
  Filled 2011-12-02: qty 1

## 2011-12-02 MED ORDER — ONDANSETRON HCL 4 MG/2ML IJ SOLN: 4.0000 mg | INTRAMUSCULAR | Status: DC | PRN

## 2011-12-02 MED ORDER — OXYTOCIN 40 UNITS IN LACTATED RINGERS INFUSION - SIMPLE MED
1.0000 m[IU]/min | INTRAVENOUS | Status: DC
  Administered 2011-12-02: 2 m[IU]/min via INTRAVENOUS
  Filled 2011-12-02: qty 1000

## 2011-12-02 MED ORDER — ENOXAPARIN SODIUM 60 MG/0.6ML ~~LOC~~ SOLN
60.0000 mg | Freq: Two times a day (BID) | SUBCUTANEOUS | Status: DC
  Administered 2011-12-03 – 2011-12-04 (×3): 60 mg via SUBCUTANEOUS
  Filled 2011-12-02 (×5): qty 0.6

## 2011-12-02 MED ORDER — PRENATAL 27-0.8 MG PO TABS: 1.0000 | ORAL_TABLET | Freq: Every day | ORAL | Status: DC

## 2011-12-02 MED ORDER — METHYLERGONOVINE MALEATE 0.2 MG PO TABS: 0.2000 mg | ORAL_TABLET | ORAL | Status: DC | PRN

## 2011-12-02 MED ORDER — LIDOCAINE HCL (PF) 1 % IJ SOLN
30.0000 mL | INTRAMUSCULAR | Status: DC | PRN
  Filled 2011-12-02: qty 30

## 2011-12-02 MED ORDER — PRENATAL MULTIVITAMIN CH
1.0000 | ORAL_TABLET | Freq: Every day | ORAL | Status: DC
  Administered 2011-12-03 – 2011-12-04 (×2): 1 via ORAL
  Filled 2011-12-02 (×2): qty 1

## 2011-12-02 MED ORDER — ZOLPIDEM TARTRATE 5 MG PO TABS
5.0000 mg | ORAL_TABLET | Freq: Every evening | ORAL | Status: DC | PRN
  Administered 2011-12-03 (×2): 5 mg via ORAL
  Filled 2011-12-02 (×2): qty 1

## 2011-12-02 MED ORDER — BENZOCAINE-MENTHOL 20-0.5 % EX AERO
1.0000 "application " | INHALATION_SPRAY | CUTANEOUS | Status: DC | PRN
  Administered 2011-12-02: 1 via TOPICAL
  Filled 2011-12-02: qty 56

## 2011-12-02 MED ORDER — ONDANSETRON HCL 4 MG PO TABS: 4.0000 mg | ORAL_TABLET | ORAL | Status: DC | PRN

## 2011-12-02 MED ORDER — MAGNESIUM HYDROXIDE 400 MG/5ML PO SUSP: 30.0000 mL | ORAL | Status: DC | PRN

## 2011-12-02 NOTE — Progress Notes (Signed)
Report called to [facility] in [place]. Will admit to 169 via w/c

## 2011-12-02 NOTE — Progress Notes (Signed)
Threasa Heads CNM in to do wet prep and amnisure. No speculum used.

## 2011-12-02 NOTE — H&P (Signed)
Regina Butler is a 26 y.o. female presenting for rupture of membranes. Maternal Medical History:  Reason for admission: Reason for admission: rupture of membranes.  Contractions: Onset was 1-2 hours ago.   Frequency: regular.   Perceived severity is moderate.    Fetal activity: Perceived fetal activity is normal.   Last perceived fetal movement was within the past hour.    Prenatal complications: Thrombophilia.   Factor V Leiden; Protein C & S Deficiency.    OB History    Grav Para Term Preterm Abortions TAB SAB Ect Mult Living   14 1 1  0 12 0 12 0 0 1     Past Medical History  Diagnosis Date  . Thrombophilia   . Protein S deficiency complicating pregnancy   . Protein C deficiency complicating pregnancy   . Factor V deficiency     heterozygote  . Class III atherosclerotic vascular disease   . Personal history of TIA (transient ischemic attack) 07/2007    facial droop/paralysis  . Rh negative status during pregnancy   . Ehler's-Danlos syndrome   . CVA (cerebral vascular accident) 07/2007    facial droop/paralysis  . SAB (spontaneous abortion)     11 previous sab  . Complication of anesthesia    Past Surgical History  Procedure Date  . Shoulder surgery   . Dilation and curettage of uterus    Family History: family history includes Birth defects in her brother. Social History:  reports that she has been smoking Cigarettes.  She has been smoking about .5 packs per day. She has never used smokeless tobacco. She reports that she does not drink alcohol or use illicit drugs.   Prenatal Transfer Tool  Maternal Diabetes: No Genetic Screening: Normal Maternal Ultrasounds/Referrals: Normal Fetal Ultrasounds or other Referrals:  None Maternal Substance Abuse:  No Significant Maternal Medications:  Meds include: Other:  Significant Maternal Lab Results:  Lab values include: Group B Strep negative Other Comments:  Factor V Leiden; Protein C & S Defieciency on Lovenox, then  switched to Heparin.  Review of Systems  Gastrointestinal: Positive for abdominal pain (contractions).  Genitourinary:       Rupture of membranes  All other systems reviewed and are negative.    Dilation: 3 Exam by:: W. Muhammed CNM Blood pressure 125/85, pulse 116, temperature 97.8 F (36.6 C), temperature source Oral, resp. rate 20, height 5\' 2"  (1.575 m), weight 69.673 kg (153 lb 9.6 oz), last menstrual period 03/04/2011. Maternal Exam:  Uterine Assessment: Contraction strength is moderate.  Contraction frequency is regular.   Abdomen: Estimated fetal weight is 7-7.5.   Fetal presentation: vertex  Introitus: Normal vulva. Normal vagina.  Vaginal discharge: mucusy.    Fetal Exam Fetal Monitor Review: Baseline rate: 140's.  Variability: moderate (6-25 bpm).   Pattern: accelerations present and late decelerations.   Questionable decelerations after contractions with resolution when on side.   Fetal State Assessment: Category II - tracings are indeterminate.     Physical Exam  Constitutional: She is oriented to person, place, and time. She appears well-developed and well-nourished. No distress.  HENT:  Head: Normocephalic.  Neck: Normal range of motion. Neck supple.  Cardiovascular: Normal rate, regular rhythm and normal heart sounds.   Respiratory: Effort normal and breath sounds normal.  GI: Soft. There is no tenderness.  Genitourinary: No bleeding around the vagina. Vaginal discharge: mucusy.       Cervix 3-4/50/-2  Musculoskeletal: Normal range of motion. She exhibits no edema.  Neurological: She  is alert and oriented to person, place, and time.  Skin: Skin is warm and dry.    Prenatal labs: ABO, Rh: A/NEG/-- (03/27 1516) Antibody: NEG (03/27 1516) Rubella: 31.4 (03/27 1516) RPR: NON REAC (08/06 0000)  HBsAg: NEGATIVE (03/27 1516)  HIV: NON REACTIVE (08/06 0000)  GBS: Negative (10/03 0000)   Assessment/Plan: Rupture of Membranes Thrombophilia GBS  negative  Plan: Admit to [facility] Pitocin augmentation Anticipate NSVD   [facility] 12/02/2011, 5:18 AM

## 2011-12-02 NOTE — Progress Notes (Signed)
   Regina Butler is a 26 y.o. [id number] at [redacted]w[redacted]d  admitted for PROM  Subjective: Said fentanyl did not work at all, wants to try another IV med.  Feels back pain with ctx and "tightening"  Objective: BP 113/71  Pulse 99  Temp 97.7 F (36.5 C) (Oral)  Resp 18  Ht 5\' 2"  (1.575 m)  Wt 69.4 kg (153 lb)  BMI 27.98 kg/m2  LMP 03/04/2011    FHT:  FHR: 150 bpm, variability: moderate,  accelerations:  Present,  decelerations:  Absent UC:   regular, every 3 minutes SVE:   Deferred   Labs: Lab Results  Component Value Date   WBC 19.7* 12/02/2011   HGB 13.4 12/02/2011   HCT 39.6 12/02/2011   MCV 91.2 12/02/2011   PLT 241 12/02/2011    Assessment / Plan: PROM, ealry labor; will try Nubain Pitocin at 14 mu/min  Labor: early Fetal Wellbeing:  Category I Pain Control:  Fentanyl Anticipated MOD:  NSVD  CRESENZO-DISHMAN,Emanuell Morina 12/02/2011, 11:05 AM

## 2011-12-02 NOTE — Anesthesia Preprocedure Evaluation (Signed)
Anesthesia Evaluation  Patient identified by MRN, date of birth, ID band Patient awake    Reviewed: Allergy & Precautions, H&P , NPO status , Patient's Chart, lab work & pertinent test results, reviewed documented beta blocker date and time   History of Anesthesia Complications Negative for: history of anesthetic complications  Airway Mallampati: II TM Distance: >3 FB Neck ROM: full    Dental  (+) Teeth Intact   Pulmonary Current Smoker,  breath sounds clear to auscultation        Cardiovascular negative cardio ROS  Rhythm:regular Rate:Normal     Neuro/Psych  Headaches, TIACVA negative psych ROS   GI/Hepatic negative GI ROS, Neg liver ROS,   Endo/Other  negative endocrine ROS  Renal/GU negative Renal ROS  negative genitourinary   Musculoskeletal   Abdominal   Peds  Hematology  (+) Blood dyscrasia (factor V leiden, protein C and S deficiency - was on lovenox during pregnancy, switched to heparin.  last heparin dose two days ago.), ,   Anesthesia Other Findings   Reproductive/Obstetrics (+) Pregnancy                           Anesthesia Physical Anesthesia Plan  ASA: II  Anesthesia Plan: Epidural   Post-op Pain Management:    Induction:   Airway Management Planned:   Additional Equipment:   Intra-op Plan:   Post-operative Plan:   Informed Consent: I have reviewed the patients History and Physical, chart, labs and discussed the procedure including the risks, benefits and alternatives for the proposed anesthesia with the patient or authorized representative who has indicated his/her understanding and acceptance.     Plan Discussed with:   Anesthesia Plan Comments:         Anesthesia Quick Evaluation

## 2011-12-02 NOTE — Progress Notes (Signed)
   Subjective: Pt reports decrease in feeling contractions.  No questions or concerns.  Declines wearing SCDs, verbalizes understanding of the risk.   Objective: BP 122/78  Pulse 105  Temp 97.7 F (36.5 C) (Oral)  Resp 18  Ht 5\' 2"  (1.575 m)  Wt 69.4 kg (153 lb)  BMI 27.98 kg/m2  LMP 03/04/2011      FHT:  FHR: 140's bpm, variability: minimal ,  accelerations:  Present,  decelerations:  Absent UC:   irregular, every 2-5 minutes SVE:   Dilation: 3 Exam by:: W. Muhammed CNM  Labs: Lab Results  Component Value Date   WBC 16.6* 09/11/2011   HGB 12.0 09/11/2011   HCT 35.1* 09/11/2011   MCV 91.2 09/11/2011   PLT 199 09/11/2011    Assessment / Plan: Premature Rupture of Membranes GBS negative  Labor: Premature Rupture of Membranes Preeclampsia:  n/a Fetal Wellbeing:  Category I Pain Control:  Labor support without medications I/D:  n/a Anticipated MOD:  NSVD Begin pitocin augmentation.  [facility] 12/02/2011, 6:38 AM

## 2011-12-02 NOTE — MAU Provider Note (Signed)
History     CSN: [id number]  Arrival date and time: 12/02/11 0305   None     Chief Complaint  Patient presents with  . Rupture of Membranes   HPI  Pt is a G12P10101 at 39 wks here with report of leaking of a clear fluid x 24 hours.  Last intercourse 48 hours ago.  Report of contractions increasing in frequency in the past 20 minutes.  No report of vaginal bleeding.  +fetal movement.    Past Medical History  Diagnosis Date  . Thrombophilia   . Protein S deficiency complicating pregnancy   . Protein C deficiency complicating pregnancy   . Factor V deficiency     heterozygote  . Class III atherosclerotic vascular disease   . Personal history of TIA (transient ischemic attack) 07/2007    facial droop/paralysis  . Rh negative status during pregnancy   . Ehler's-Danlos syndrome   . CVA (cerebral vascular accident) 07/2007    facial droop/paralysis  . SAB (spontaneous abortion)     11 previous sab  . Complication of anesthesia     Past Surgical History  Procedure Date  . Shoulder surgery   . Dilation and curettage of uterus     Family History  Problem Relation Age of Onset  . Birth defects Brother     tethered spinal cord/club foot    History  Substance Use Topics  . Smoking status: Current Every Day Smoker -- 0.5 packs/day    Types: Cigarettes  . Smokeless tobacco: Never Used  . Alcohol Use: No    Allergies:  Allergies  Allergen Reactions  . Amoxicillin   . Coconut Fatty Acids   . Estrogens     Due to blood clotting disorder. Estrogen would make clotting worse per pt's hemetologist  . Naprosyn (Naproxen)   . Penicillins     Prescriptions prior to admission  Medication Sig Dispense Refill  . calcium carbonate (OS-CAL) 1250 MG chewable tablet Chew 1 tablet by mouth daily.      . heparin 08657 UNIT/ML injection 10,000 units SQ TID  5 mL  30  . Prenatal Vit-Fe Fumarate-FA (MULTIVITAMIN-PRENATAL) 27-0.8 MG TABS Take 1 tablet by mouth daily.      Marland Kitchen  enoxaparin (LOVENOX) 60 MG/0.6ML injection Inject 0.6 mLs (60 mg total) into the skin 2 (two) times daily.  60 Syringe  5  . Fluticasone-Salmeterol (ADVAIR DISKUS) 100-50 MCG/DOSE AEPB Inhale 1 puff into the lungs 2 (two) times daily.  60 each  0    Review of Systems  Gastrointestinal: Positive for abdominal pain (contractions).  Genitourinary:       Vaginal discharge; slightly yellow.  All other systems reviewed and are negative.   Physical Exam   Blood pressure 125/85, pulse 116, temperature 97.8 F (36.6 C), temperature source Oral, resp. rate 20, height 5\' 2"  (1.575 m), weight 69.673 kg (153 lb 9.6 oz), last menstrual period 03/04/2011.  Physical Exam  Constitutional: She is oriented to person, place, and time. She appears well-developed and well-nourished. No distress.       Appears uncomfortable with contractions.  HENT:  Head: Normocephalic.  Neck: Normal range of motion. Neck supple.  Cardiovascular: Normal rate, regular rhythm and normal heart sounds.   Respiratory: Effort normal and breath sounds normal. No respiratory distress.  GI: Soft. She exhibits no distension.  Genitourinary: No bleeding around the vagina. Vaginal discharge (mucusy) found.       No pooling of fluid noted; thin consistency yellow dc.  Cervix 3/50/-2  Neurological: She is alert and oriented to person, place, and time.  Skin: Skin is warm and dry.    MAU Course  Procedures No results found for this or any previous visit (from the past 24 hour(s)).   Assessment and Plan   Assessment/Plan:  Rupture of Membranes  Thrombophilia  GBS negative   Plan:  Admit to Birthing Suites  Pitocin augmentation  Anticipate NSVD    [facility] 12/02/2011, 4:30 AM

## 2011-12-02 NOTE — Progress Notes (Signed)
Threasa Heads CNM in. Spec exam done to r/o rupture of membranes. Fern slide made

## 2011-12-02 NOTE — Progress Notes (Signed)
   Regina Butler is a 26 y.o. [id number] at [redacted]w[redacted]d  admitted for PROM  Subjective:  Wants to walk around.  Feels like contractions are getting "a little stronger" Objective: BP 108/73  Pulse 91  Temp 97.7 F (36.5 C) (Oral)  Resp 16  Ht 5\' 2"  (1.575 m)  Wt 69.4 kg (153 lb)  BMI 27.98 kg/m2  LMP 03/04/2011    FHT:  FHR: 150 bpm, variability: moderate,  accelerations:  Present,  decelerations:  Absent UC:   regular, every 2-3 minutes, mild SVE:   Deferred Pitocin @ 8 mu/min  Labs: Lab Results  Component Value Date   WBC 19.7* 12/02/2011   HGB 13.4 12/02/2011   HCT 39.6 12/02/2011   MCV 91.2 12/02/2011   PLT 241 12/02/2011    Assessment / Plan: PROM X 18 hours  Labor: latent phase Fetal Wellbeing:  Category I Pain Control:  Labor support without medications Anticipated MOD:  NSVD  CRESENZO-DISHMAN,Macil Crady 12/02/2011, 9:22 AM

## 2011-12-02 NOTE — Anesthesia Procedure Notes (Signed)
Epidural Patient location during procedure: OB Start time: 12/02/2011 11:41 AM  Staffing Performed by: anesthesiologist   Preanesthetic Checklist Completed: patient identified, site marked, surgical consent, pre-op evaluation, timeout performed, IV checked, risks and benefits discussed and monitors and equipment checked  Epidural Patient position: sitting Prep: site prepped and draped and DuraPrep Patient monitoring: continuous pulse ox and blood pressure Approach: midline Injection technique: LOR air  Needle:  Needle type: Tuohy  Needle gauge: 17 G Needle length: 9 cm and 9 Needle insertion depth: 5 cm cm Catheter type: closed end flexible Catheter size: 19 Gauge Catheter at skin depth: 10 cm Test dose: negative  Assessment Events: blood not aspirated, injection not painful, no injection resistance, negative IV test and no paresthesia  Additional Notes Discussed risk of headache, infection, bleeding, nerve injury and failed or incomplete block.  Patient voices understanding and wishes to proceed. Reason for block:procedure for pain

## 2011-12-02 NOTE — Progress Notes (Signed)
Threasa Heads CNM notified of pt's admission and status. Aware of coag disorder and ? SROM . Will see pt

## 2011-12-03 ENCOUNTER — Inpatient Hospital Stay (HOSPITAL_COMMUNITY): Admission: RE | Admit: 2011-12-03 | Payer: PRIVATE HEALTH INSURANCE | Source: Ambulatory Visit

## 2011-12-03 MED ORDER — INFLUENZA VIRUS VACC SPLIT PF IM SUSP: 0.5000 mL | INTRAMUSCULAR | Status: DC

## 2011-12-03 NOTE — Anesthesia Postprocedure Evaluation (Signed)
  Anesthesia Post-op Note  Patient: Regina Butler  Procedure(s) Performed: * No procedures listed *  Patient Location: Mother/Baby  Anesthesia Type: @ANTYPEFINAL @   Level of Consciousness: awake, alert  and oriented  Airway and Oxygen Therapy: Patient Spontanous Breathing  Post-op Pain: none  Post-op Assessment: Patient's Cardiovascular Status Stable, Respiratory Function Stable, Patent Airway, No signs of Nausea or vomiting, Adequate PO intake, Pain level controlled, No headache, No backache, No residual numbness and No residual motor weakness  Post-op Vital Signs: Reviewed and stable  Complications: No apparent anesthesia complications

## 2011-12-03 NOTE — Progress Notes (Signed)
Pt seen and examined.  She is doing well.  Pt would like to change lovenox doses to 8 am and 8 pm instead of midnight and noon.  Pt will get next injection at 8 pm..  She understands she may have a period of time where the lovenox is not at therapeutic levels, but she would still like to change the dose times.  She will not be able to give herself injections at midnight at home.  Agree with above note.  Aleyza Salmi H. 9:19 AM

## 2011-12-03 NOTE — Progress Notes (Signed)
Post Partum Day 1 Subjective: no complaints  Patient states that she is feeling tired this morning, but has no complaints.  She reports using 1 pad overnight for bleeding, but pad was not soaked.  She is tolerating diet without problems and is ambulating with little discomfort.  She states that she would prefer to be discharged tomorrow to allow her 1 more day of rest before coming home to family.  She also states that she will be using an IUD for contraception.  Objective: Blood pressure 105/70, pulse 90, temperature 98 F (36.7 C), temperature source Oral, resp. rate 18, height 5\' 2"  (1.575 m), weight 69.4 kg (153 lb), last menstrual period 03/04/2011, SpO2 96.00%, unknown if currently breastfeeding.  Physical Exam:  General: alert, cooperative and no distress Lochia: appropriate Uterine Fundus: firm DVT Evaluation: No evidence of DVT seen on physical exam.   Basename 12/02/11 0625  HGB 13.4  HCT 39.6    Assessment/Plan: Plan for discharge tomorrow Lovenox therapy for anticoagulation   LOS: 1 day   Regina Butler 12/03/2011, 7:47 AM

## 2011-12-04 ENCOUNTER — Encounter: Payer: PRIVATE HEALTH INSURANCE | Admitting: Family Medicine

## 2011-12-04 LAB — TYPE AND SCREEN
ABO/RH(D): A NEG
Antibody Screen: NEGATIVE
Unit division: 0

## 2011-12-04 MED ORDER — ENOXAPARIN SODIUM 150 MG/ML ~~LOC~~ SOLN: 60.0000 mg | Freq: Two times a day (BID) | SUBCUTANEOUS | Status: DC

## 2011-12-04 MED ORDER — PNEUMOCOCCAL VAC POLYVALENT 25 MCG/0.5ML IJ INJ
0.5000 mL | INJECTION | Freq: Once | INTRAMUSCULAR | Status: AC
  Administered 2011-12-04: 0.5 mL via INTRAMUSCULAR
  Filled 2011-12-04: qty 0.5

## 2011-12-04 NOTE — Discharge Summary (Signed)
Obstetric Discharge Summary Reason for Admission: onset of labor Prenatal Procedures: none Intrapartum Procedures: spontaneous vaginal delivery Postpartum Procedures: none Complications-Operative and Postpartum: none Hemoglobin  Date Value Range Status  12/02/2011 13.4  12.0 - 15.0 g/dL Final     HCT  Date Value Range Status  12/02/2011 39.6  36.0 - 46.0 % Final    Physical Exam:  General: alert, cooperative and no distress Lochia: appropriate Uterine Fundus: firm DVT Evaluation: No evidence of DVT seen on physical exam.  Discharge Diagnoses: Term Pregnancy-delivered  Discharge Information: Date: 12/04/2011 Activity: pelvic rest Diet: routine Medications: Lovenox 60 BID Condition: stable Instructions: refer to practice specific booklet Discharge to: home  Patient prefers Mirena IUD for contraception  Newborn Data: Live born female  Birth Weight: 6 lb 12.8 oz (3084 g) APGAR: 9, 10  Home with mother.  Nira Conn 12/04/2011, 7:18 AM

## 2011-12-05 NOTE — Discharge Summary (Signed)
I saw and examined patient and agree with above. Natacha Jepsen, MD 

## 2012-01-09 ENCOUNTER — Ambulatory Visit: Payer: PRIVATE HEALTH INSURANCE | Admitting: Obstetrics & Gynecology

## 2012-01-09 ENCOUNTER — Ambulatory Visit (HOSPITAL_COMMUNITY)
Admission: RE | Admit: 2012-01-09 | Discharge: 2012-01-09 | Disposition: A | Discharge status: Home / Self Care | Payer: PRIVATE HEALTH INSURANCE | Source: Ambulatory Visit | Attending: Obstetrics & Gynecology | Admitting: Obstetrics & Gynecology

## 2012-01-09 NOTE — Progress Notes (Signed)
Adult Lactation Consultation Outpatient Visit Note  Patient Name: Regina Butler(mother)   BABY: Flora Lipps Date of Birth: 1985/12/10                                  DOB: [birth year] Gestational Age at Delivery: 88 WEEKS       BIRTH WEIGHT: 6-12.8 Type of Delivery: NVD                                     WEIGHT TODAY: 9-7.9  Breastfeeding History: Frequency of Breastfeeding: EVERY 2-3 HOURS WITH ONE 4-5 HOUR NAP/24 HOURS Length of Feeding: 3-6 MINUTES Voids: QS Stools: QS  Supplementing / Method:1 3 OZ SOY FORMULA BOTTLE PER DAY AS RECOMMENDED PER PEDI FOR GAS PROBLEMS Pumping:  Type of Pump:DEBP   Frequency:EVERY 3 HOURS  POST PUMPING  Volume:  7 OZ TOTAL BOTH BREASTS  Comments:    Consultation Evaluation:  Mom states breastfeeding has improved in past 2 weeks since making this appointment.  Mom felt at that point milk supply was down because baby was not acting full and she was unable to pump any milk.  The past 2 weeks she has been taking fenugreek, lactation support and pumping after each feeding.  Mom now pumps 7 oz total after each feeding.  Mom was worried that baby's feeding duration was very short but after attending the BF support group 1 week ago and talking with an LC it was decided that due to her good milk supply and fast letdown baby was getting full quickly.  Baby has gained 11 oz/8 days.  Mom states baby comes off choking with some feedings and she uses position adjustment to assist with this.  Both breasts full today.  Observed mom latch baby onto right breast easily and well using cross cradle hold.  Baby nurses with soft, slow sucking style to control flow from being too fast.  Baby nursed for about 30 minutes on one breast and took in 80 mls then came off content and relaxed.  Mom feeling more confident.  Discussed weaning down from pumping since supply is ample.  Mom will call with concerns.  Initial Feeding Assessment:right breast 30 minutes Pre-feed  Weight:4306 Post-feed NWGNFA:2130 Amount Transferred:80 mls Comments:  Additional Feeding Assessment: Pre-feed Weight: Post-feed Weight: Amount Transferred: Comments:  Additional Feeding Assessment: Pre-feed Weight: Post-feed Weight: Amount Transferred: Comments:  Total Breast milk Transferred this Visit: 80 MLS Total Supplement Given: NONE  Additional Interventions:   Follow-Up  Plans to continue to attend breastfeeding support group weekly      Hansel Feinstein 01/09/2012, 5:08 PM

## 2012-01-11 ENCOUNTER — Ambulatory Visit (INDEPENDENT_AMBULATORY_CARE_PROVIDER_SITE_OTHER): Payer: PRIVATE HEALTH INSURANCE | Admitting: Obstetrics and Gynecology

## 2012-01-11 ENCOUNTER — Encounter: Payer: Self-pay | Admitting: Obstetrics and Gynecology

## 2012-01-11 VITALS — BP 118/80 | HR 104 | Ht 61.5 in | Wt 135.0 lb

## 2012-01-11 DIAGNOSIS — D6859 Other primary thrombophilia: Secondary | ICD-10-CM

## 2012-01-11 NOTE — Progress Notes (Signed)
  Subjective:     Regina Butler is a 26 y.o. female who presents for a postpartum visit. She is 5 weeks postpartum following a spontaneous vaginal delivery. I have fully reviewed the prenatal and intrapartum course. The delivery was at 39 gestational weeks. Outcome: spontaneous vaginal delivery. Anesthesia: epidural. Postpartum course has been uncomplicated. Baby's course has been uncomplicated. Baby is feeding by breast. Bleeding no bleeding. Bowel function is normal. Bladder function is normal. Patient is not sexually active. Contraception method is IUD. Postpartum depression screening: negative.     Review of Systems A comprehensive review of systems was negative.   Objective:    BP 118/80  Pulse 104  Ht 5' 1.5" (1.562 m)  Wt 135 lb (61.236 kg)  BMI 25.10 kg/m2  Breastfeeding? Yes  General:  alert, cooperative and no distress   Breasts:  inspection negative, no nipple discharge or bleeding, no masses or nodularity palpable  Lungs: clear to auscultation bilaterally  Heart:  regular rate and rhythm  Abdomen: soft, non-tender; bowel sounds normal; no masses,  no organomegaly   Vulva:  normal  Vagina: normal vagina, no discharge, exudate, lesion, or erythema  Cervix:  multiparous appearance  Corpus: normal size, contour, position, consistency, mobility, non-tender  Adnexa:  normal adnexa and no mass, fullness, tenderness  Rectal Exam: Not performed.        Assessment:    Normal postpartum exam. Pap smear not done at today's visit.   Plan:    1. Contraception: IUD 2. Patient already has appointment for IUD insertion 3. Follow up in: April for annual exam  or as needed.

## 2012-01-21 ENCOUNTER — Ambulatory Visit (INDEPENDENT_AMBULATORY_CARE_PROVIDER_SITE_OTHER): Payer: PRIVATE HEALTH INSURANCE | Admitting: Obstetrics & Gynecology

## 2012-01-21 ENCOUNTER — Encounter: Payer: Self-pay | Admitting: Obstetrics & Gynecology

## 2012-01-21 VITALS — BP 121/75 | HR 85 | Ht 62.0 in | Wt 139.0 lb

## 2012-01-21 DIAGNOSIS — Z30430 Encounter for insertion of intrauterine contraceptive device: Secondary | ICD-10-CM

## 2012-01-21 DIAGNOSIS — Z975 Presence of (intrauterine) contraceptive device: Secondary | ICD-10-CM

## 2012-01-21 NOTE — Progress Notes (Signed)
  Subjective:    Patient ID: Regina Butler, female    DOB: [birth year], 26 y.o.   MRN: [id number]  HPI  She is here for her insertion of Paraguard IUD. She opts for this one because of her bleeding disorder. I did discuss with her that the Mirena does not contain estrogen. She has not had sex since her delivery.  Review of Systems Her annual is due in April.    Objective:   Physical Exam  Normal appearing cervix NSSA The consent was signed and a time out was done. All questions were answered. Her cervix was prepped with betadine. Sterile gloves were then donned. A Paraguard IUD was easily placed inside the uterus with the assistance of a single tooth tenaculum placed on the anterior lip of the cervix. The strings were cut to about 3-4 cm. She tolerated the procedure well.     Assessment & Plan:   Contraception-Paraguard RTC April for annual/prn sooner.

## 2012-06-17 ENCOUNTER — Ambulatory Visit (INDEPENDENT_AMBULATORY_CARE_PROVIDER_SITE_OTHER): Payer: PRIVATE HEALTH INSURANCE | Admitting: Obstetrics and Gynecology

## 2012-06-17 DIAGNOSIS — Z30431 Encounter for routine checking of intrauterine contraceptive device: Secondary | ICD-10-CM

## 2012-06-17 NOTE — Patient Instructions (Signed)

## 2012-06-17 NOTE — Progress Notes (Signed)
  Subjective:    Patient ID: Regina Butler, female    DOB: [birth year], 27 y.o.   MRN: [id number]  HPI  27 yo here for IUD check. Patient had IUD inserted in December 2013. She reports daily spotting with Paraguard IUD. She has no other complaints. She states that prior to the IUD she had rare menses despite being on Lovenox. Patient desires to have this IUD check and is considering changing to Mirena IUD  Past Medical History  Diagnosis Date  . Thrombophilia   . Protein S deficiency complicating pregnancy   . Protein C deficiency complicating pregnancy   . Factor V deficiency     heterozygote  . Class III atherosclerotic vascular disease   . Personal history of TIA (transient ischemic attack) 07/2007    facial droop/paralysis  . Rh negative status during pregnancy   . Ehler's-Danlos syndrome   . CVA (cerebral vascular accident) 07/2007    facial droop/paralysis  . SAB (spontaneous abortion)     11 previous sab  . Complication of anesthesia    Past Surgical History  Procedure Laterality Date  . Shoulder surgery    . Dilation and curettage of uterus     Family History  Problem Relation Age of Onset  . Birth defects Brother     tethered spinal cord/club foot   History  Substance Use Topics  . Smoking status: Current Every Day Smoker -- 0.50 packs/day    Types: Cigarettes  . Smokeless tobacco: Never Used  . Alcohol Use: No     Review of Systems     Objective:   Physical Exam  GENERAL: Well-developed, well-nourished female in no acute distress.  ABDOMEN: Soft, nontender, nondistended. No organomegaly. PELVIC: Normal external female genitalia. Vagina is pink and rugated.  Normal discharge. Normal appearing cervix. IUD strings visualized at os. Uterus is normal in size. No adnexal mass or tenderness. EXTREMITIES: No cyanosis, clubbing, or edema, 2+ distal pulses.     Assessment & Plan:  27 yo here for IUD check 6 months after insertion with daily spotting - IUD  appears to be in appropriate place - Discussed that spotting may be due to the use of lovenox as well - Discussed changing to Mirena IUD. Patient is seriously considering this option but may try to convince husband to have another child. - RTC for IUD change

## 2012-06-17 NOTE — Progress Notes (Signed)
Here today for IUD check.  She does have daily spotting.

## 2013-05-04 ENCOUNTER — Telehealth: Payer: Self-pay | Admitting: *Deleted

## 2013-05-04 DIAGNOSIS — B379 Candidiasis, unspecified: Secondary | ICD-10-CM

## 2013-05-04 MED ORDER — FLUCONAZOLE 150 MG PO TABS: 150.0000 mg | ORAL_TABLET | Freq: Once | ORAL | Status: DC

## 2013-05-04 NOTE — Telephone Encounter (Signed)
Patient is having increased vaginal irritation with clumpy white vaginal discharge.  We will call in diflucan for her.

## 2013-09-01 ENCOUNTER — Encounter: Payer: Self-pay | Admitting: *Deleted

## 2013-09-14 ENCOUNTER — Encounter: Payer: Self-pay | Admitting: Obstetrics & Gynecology

## 2013-09-14 ENCOUNTER — Ambulatory Visit (INDEPENDENT_AMBULATORY_CARE_PROVIDER_SITE_OTHER): Payer: PRIVATE HEALTH INSURANCE | Admitting: Obstetrics & Gynecology

## 2013-09-14 VITALS — BP 118/86 | HR 91 | Ht 62.0 in | Wt 144.0 lb

## 2013-09-14 DIAGNOSIS — Z0000 Encounter for general adult medical examination without abnormal findings: Secondary | ICD-10-CM

## 2013-09-14 DIAGNOSIS — Z01419 Encounter for gynecological examination (general) (routine) without abnormal findings: Secondary | ICD-10-CM

## 2013-09-14 DIAGNOSIS — Z124 Encounter for screening for malignant neoplasm of cervix: Secondary | ICD-10-CM

## 2013-09-14 DIAGNOSIS — Z1151 Encounter for screening for human papillomavirus (HPV): Secondary | ICD-10-CM

## 2013-09-14 DIAGNOSIS — R32 Unspecified urinary incontinence: Secondary | ICD-10-CM

## 2013-09-14 MED ORDER — TOLTERODINE TARTRATE 1 MG PO TABS: 1.0000 mg | ORAL_TABLET | Freq: Two times a day (BID) | ORAL | Status: DC

## 2013-09-14 NOTE — Progress Notes (Signed)
Subjective:    Regina Butler is a 28 y.o. female who presents for an annual exam. The patient has no complaints today. She tells me that she had her first occasion of urinary incontinece this past weekend. This was an occasion of urge incontinence.  This sensation started a few months ago. The patient is sexually active. However her husband has low testosterone.  GYN screening history: last pap: was normal. The patient wears seatbelts: yes. The patient participates in regular exercise: yes. Has the patient ever been transfused or tattooed?: yes. The patient reports that there is not domestic violence in her life.   Menstrual History: OB History   Grav Para Term Preterm Abortions TAB SAB Ect Mult Living   14 2 2  0 12 0 12 0 0 2      Menarche age: [age] Patient's last menstrual period was 08/14/2013.    The following portions of the patient's history were reviewed and updated as appropriate: allergies, current medications, past family history, past medical history, past social history, past surgical history and problem list.  Review of Systems A comprehensive review of systems was negative. She has been married for 10 years. She owns a public adjusting firm.   Objective:    BP 118/86  Pulse 91  Ht 5\' 2"  (1.575 m)  Wt 144 lb (65.318 kg)  BMI 26.33 kg/m2  LMP 08/14/2013  Breastfeeding? No  General Appearance:    Alert, cooperative, no distress, appears stated age  Head:    Normocephalic, without obvious abnormality, atraumatic  Eyes:    PERRL, conjunctiva/corneas clear, EOM's intact, fundi    benign, both eyes  Ears:    Normal TM's and external ear canals, both ears  Nose:   Nares normal, septum midline, mucosa normal, no drainage    or sinus tenderness  Throat:   Lips, mucosa, and tongue normal; teeth and gums normal  Neck:   Supple, symmetrical, trachea midline, no adenopathy;    thyroid:  no enlargement/tenderness/nodules; no carotid   bruit or JVD  Back:     Symmetric, no  curvature, ROM normal, no CVA tenderness  Lungs:     Clear to auscultation bilaterally, respirations unlabored  Chest Wall:    No tenderness or deformity   Heart:    Regular rate and rhythm, S1 and S2 normal, no murmur, rub   or gallop  Breast Exam:    No tenderness, masses, or nipple abnormality  Abdomen:     Soft, non-tender, bowel sounds active all four quadrants,    no masses, no organomegaly  Genitalia:    Normal female without lesion, discharge or tenderness, NSSR, NT, mobile, normal adnexal exam     Extremities:   Extremities normal, atraumatic, no cyanosis or edema  Pulses:   2+ and symmetric all extremities  Skin:   Skin color, texture, turgor normal, no rashes or lesions  Lymph nodes:   Cervical, supraclavicular, and axillary nodes normal  Neurologic:   CNII-XII intact, normal strength, sensation and reflexes    throughout  .    Assessment:    Healthy female exam.  New onset urge incontinence   Plan:     Breast self exam technique reviewed and patient encouraged to perform self-exam monthly. Thin prep Pap smear.  Check UA, UC&S If uc&s is negative, then start detrol LA 1mg  and f/u in a month

## 2013-09-15 LAB — URINALYSIS
Bilirubin Urine: NEGATIVE
Glucose, UA: NEGATIVE mg/dL
HGB URINE DIPSTICK: NEGATIVE
KETONES UR: NEGATIVE mg/dL
Leukocytes, UA: NEGATIVE
NITRITE: NEGATIVE
PROTEIN: NEGATIVE mg/dL
SPECIFIC GRAVITY, URINE: 1.008 (ref 1.005–1.030)
UROBILINOGEN UA: 0.2 mg/dL (ref 0.0–1.0)
pH: 5.5 (ref 5.0–8.0)

## 2013-09-16 LAB — URINE CULTURE
Colony Count: NO GROWTH
ORGANISM ID, BACTERIA: NO GROWTH

## 2013-09-17 LAB — CYTOLOGY - PAP

## 2013-12-07 ENCOUNTER — Encounter: Payer: Self-pay | Admitting: Obstetrics & Gynecology

## 2014-02-17 ENCOUNTER — Ambulatory Visit (INDEPENDENT_AMBULATORY_CARE_PROVIDER_SITE_OTHER): Payer: PRIVATE HEALTH INSURANCE | Admitting: Advanced Practice Midwife

## 2014-02-17 ENCOUNTER — Encounter: Payer: Self-pay | Admitting: Advanced Practice Midwife

## 2014-02-17 VITALS — BP 113/81 | HR 95 | Ht 62.0 in | Wt 146.0 lb

## 2014-02-17 DIAGNOSIS — F32A Depression, unspecified: Secondary | ICD-10-CM | POA: Insufficient documentation

## 2014-02-17 DIAGNOSIS — F329 Major depressive disorder, single episode, unspecified: Secondary | ICD-10-CM

## 2014-02-17 MED ORDER — SERTRALINE HCL 50 MG PO TABS: 50.0000 mg | ORAL_TABLET | Freq: Every day | ORAL | Status: DC

## 2014-02-17 NOTE — Progress Notes (Signed)
   Subjective:    Patient ID: Marnee GuarneriAshleigh A Struble, female    DOB: 05/03/1985, 29 y.o.   MRN: [id number]  HPI This is a 29 y.o. female who is just over 2 years postpartum who presents with c/o depression, anxiety and panic attacks. States has been this way off and on since delivery. Has had marital issues which resolved, but she still continues to have problems with sadness, crying, anxiety and some panic attacks. Denies SI/HI. Does want counseling and meds. Has gone to counseling a few times but none recently .    Review of Systems  Constitutional: Negative for activity change and fatigue.  Gastrointestinal: Negative for abdominal pain.  Neurological: Negative for dizziness and weakness.  Psychiatric/Behavioral: Positive for sleep disturbance and dysphoric mood. Negative for hallucinations, confusion, self-injury and agitation. The patient is nervous/anxious.        Objective:   Physical Exam  Constitutional: She is oriented to person, place, and time. She appears well-developed and well-nourished. No distress (but tearful at times during interview).  HENT:  Head: Normocephalic.  Neck: Normal range of motion. Neck supple.  Cardiovascular: Normal rate and regular rhythm.   Pulmonary/Chest: Effort normal. No respiratory distress.  Musculoskeletal: Normal range of motion.  Neurological: She is alert and oriented to person, place, and time.  Skin: Skin is warm and dry.  Psychiatric: Her behavior is normal. Thought content normal. Her mood appears anxious. Her affect is labile. Her affect is not angry and not inappropriate. Her speech is not slurred. She is not agitated, not aggressive and not withdrawn. Thought content is not delusional. Cognition and memory are not impaired. She does not express impulsivity or inappropriate judgment. She exhibits a depressed mood. She expresses no homicidal and no suicidal ideation. She expresses no suicidal plans and no homicidal plans. She exhibits normal  recent memory and normal remote memory.  States cries frequently States has panic attacks at times Her 2 yo child today frequently states "I am sad" She is attentive.    PP Depression score = 23      Assessment & Plan:  A:  Symptoms of depression and anxiety.       2 years Postpartum  P:  Discussed with Dr Jolayne Pantheronstant       Will start Zoloft 50mg  qd        Warned not to stop abruptly       Referred for counseling

## 2014-02-17 NOTE — Patient Instructions (Signed)

## 2014-03-03 IMAGING — US US OB DETAIL+14 WK
1 series · 12 of 28 positions shown · non-contrast
Comparison: none

[Series 1: us ob detail+14 wk · 0.19mm/px · 12 of 76 slices shown]
[im 3/76]
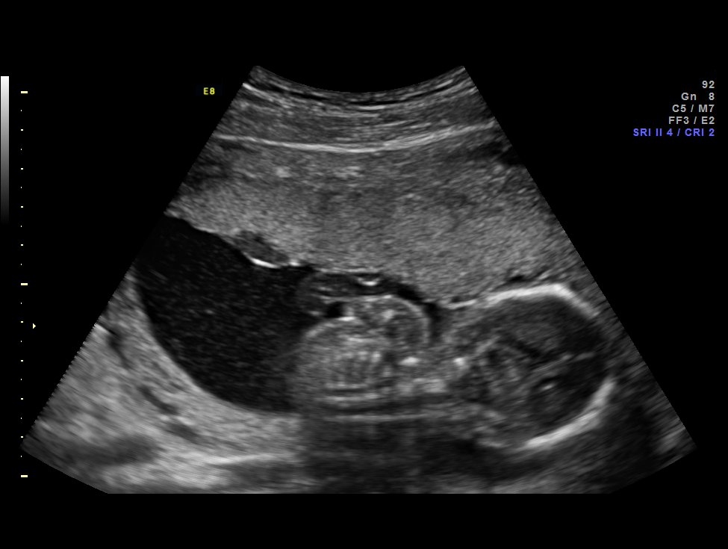
[im 9/76]
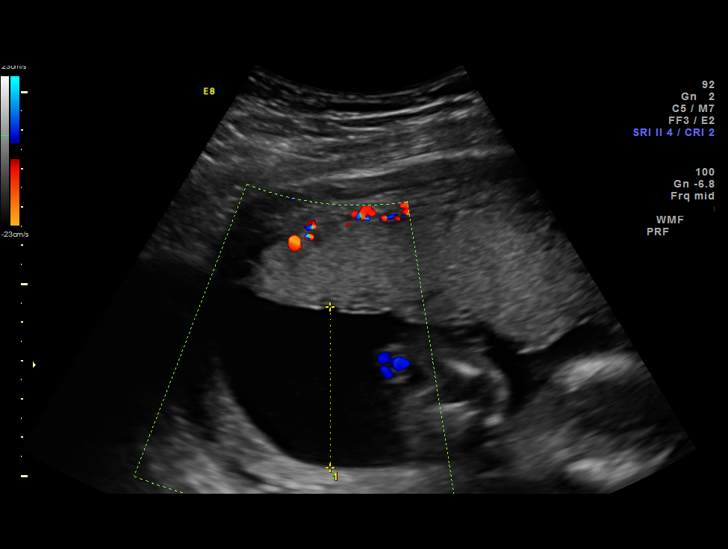
[im 14/76]
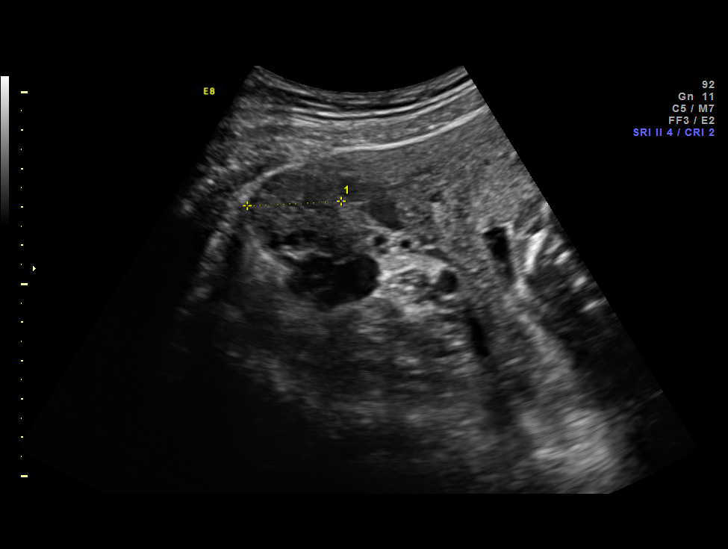
[im 23/76]
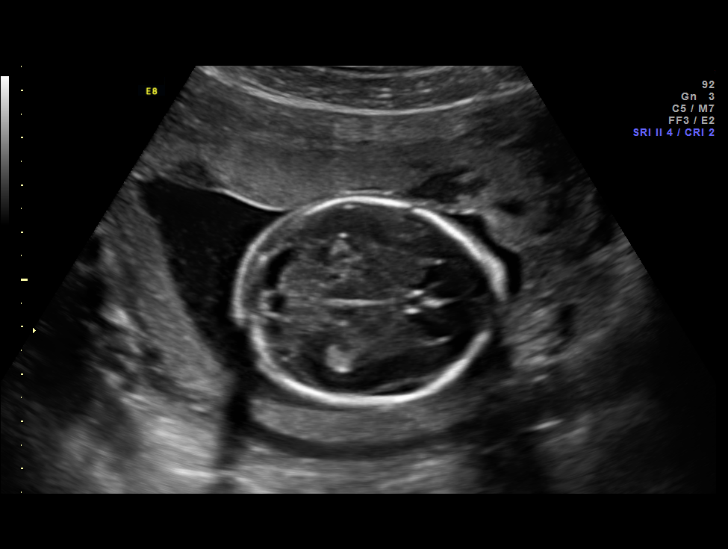
[im 28/76]
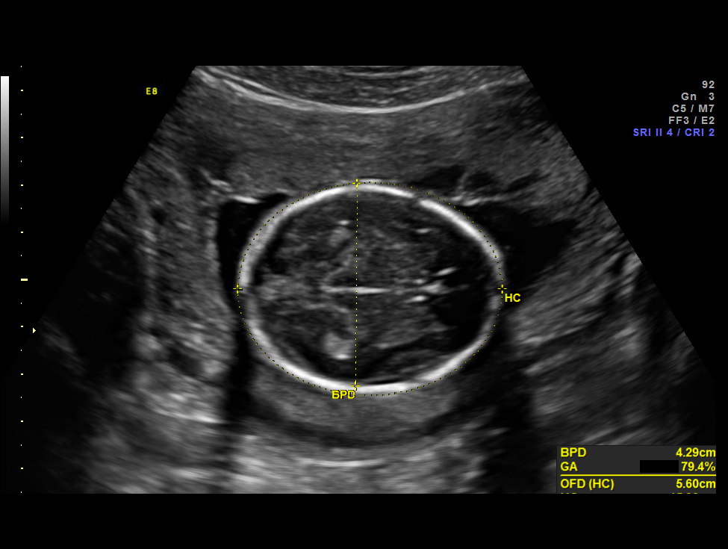
[im 34/76]
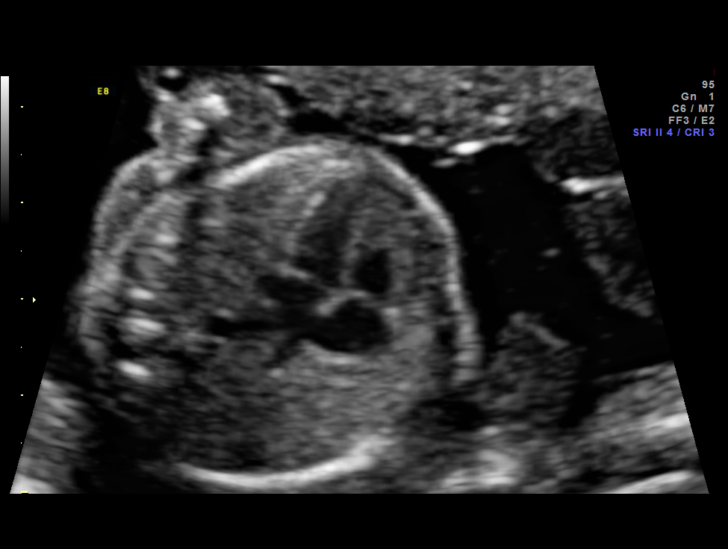
[im 42/76]
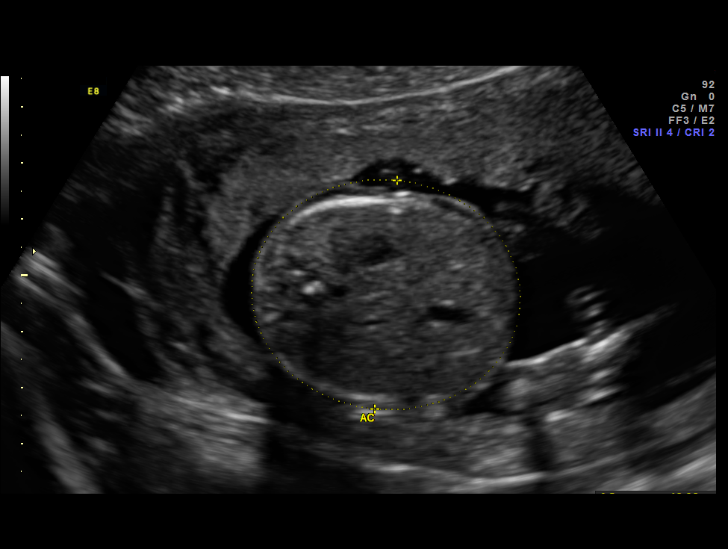
[im 48/76]
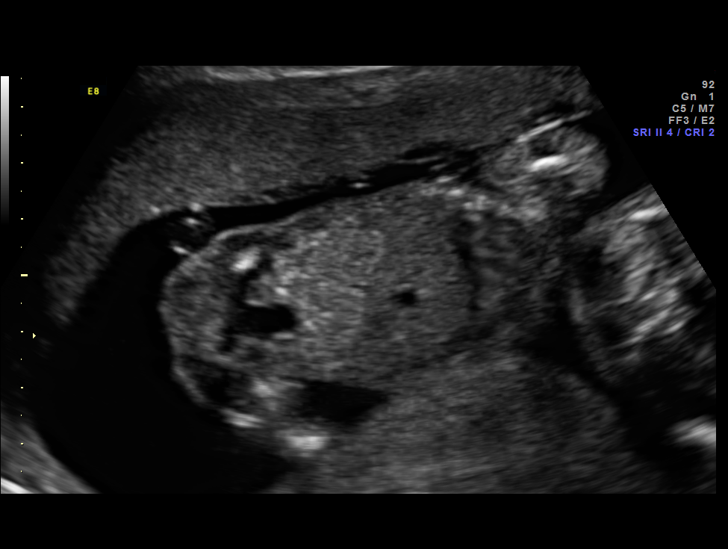
[im 53/76]
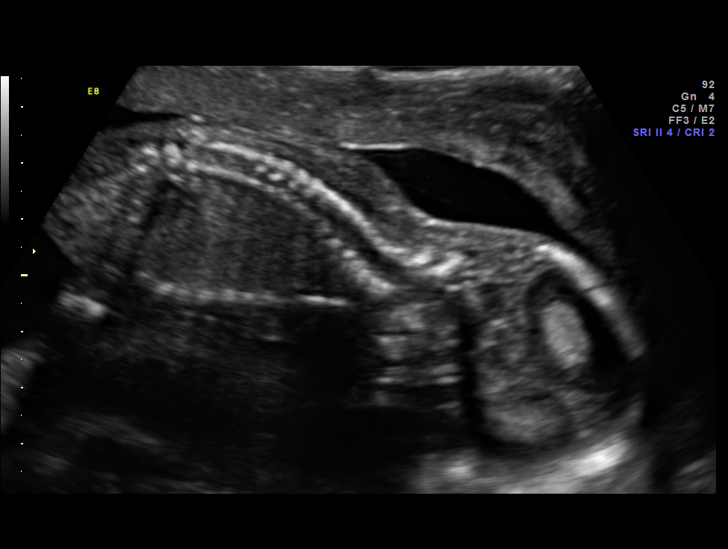
[im 62/76]
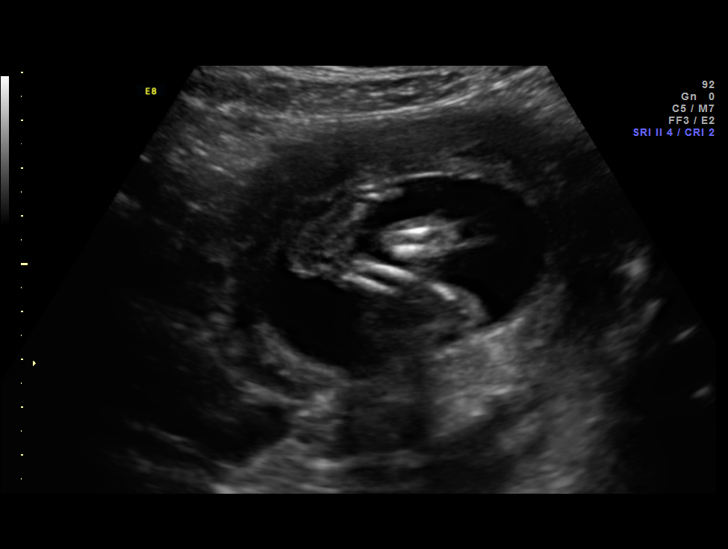
[im 67/76]
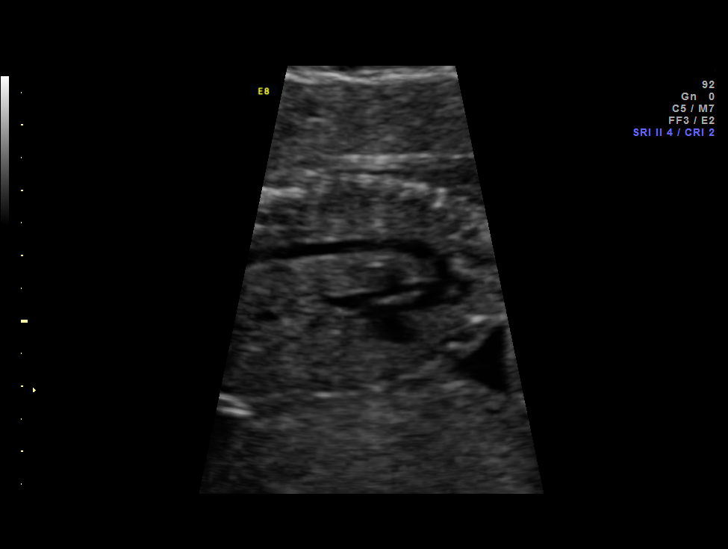
[im 73/76]
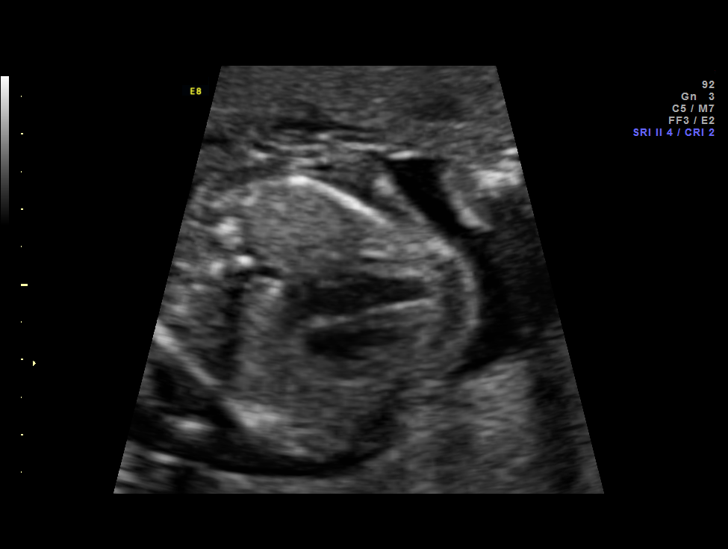

[12 of 28 positions shown; findings below may reference images not displayed]

OBSTETRICS REPORT
                      (Signed Final 07/10/2011 [DATE])

 Order#:         [id number]
Procedures

 US OB DETAIL + 14 WK                                  76811.0
Indications

 Detailed fetal anatomic survey
 Poor obstetric history-Recurrent (habitual) abortion
 (3 consecutive ab's)
 Factor V Leiden mutation
 RH Negative
 TIA
 Ehlers-Danlos syndrome
 Protein S deficiency
 Protein C deficiency
Fetal Evaluation

 Fetal Heart Rate:  145                          bpm
 Cardiac Activity:  Observed
 Presentation:      Cephalic
 Placenta:          Anterior, above cervical os
 P. Cord            Visualized
 Insertion:

 Amniotic Fluid
 AFI FV:      Subjectively within normal limits
                                             Larg Pckt:     4.2  cm
Biometry

 BPD:     42.8  mm     G. Age:  19w 0d                CI:         76.4   70 - 86
 OFD:       56  mm                                    FL/HC:      17.5   15.8 -
                                                                         18
 HC:     159.2  mm     G. Age:  18w 6d       66  %    HC/AC:      1.13   1.07 -

 AC:     141.1  mm     G. Age:  19w 4d       82  %    FL/BPD:
 FL:      27.9  mm     G. Age:  18w 4d       54  %    FL/AC:      19.8   20 - 24
 HUM:     26.1  mm     G. Age:  18w 1d       53  %
 CER:     18.3  mm     G. Age:  18w 1d       44  %
 NFT:      3.2  mm

 Est. FW:     269  gm      0 lb 9 oz     59  %
Gestational Age

 LMP:           18w 2d        Date:  03/04/11                 EDD:   12/09/11
 U/S Today:     19w 0d                                        EDD:   12/04/11
 Best:          18w 2d     Det. By:  LMP  (03/04/11)          EDD:   12/09/11
Anatomy

 Cranium:           Appears normal      Aortic Arch:       Appears normal
 Fetal Cavum:       Appears normal      Ductal Arch:       Appears normal
 Ventricles:        Appears normal      Diaphragm:         Appears normal
 Choroid Plexus:    Appears normal      Stomach:           Appears
                                                           normal, left
                                                           sided
 Cerebellum:        Appears normal      Abdomen:           Appears normal
 Posterior Fossa:   Appears normal      Abdominal Wall:    Appears nml
                                                           (cord insert,
                                                           abd wall)
 Nuchal Fold:       Appears normal      Cord Vessels:      Appears normal
                    (neck, nuchal                          (3 vessel cord)
                    fold)
 Face:              Appears normal      Kidneys:           Appear normal
                    (lips/profile/orbit
                    s)
 Heart:             Appears normal      Bladder:           Appears normal
                    (4 chamber &
                    axis)
 RVOT:              Appears normal      Spine:             Appears normal
 LVOT:              Appears normal      Limbs:             Four extremities
                                                           seen

 Other:     Fetus appears to be a female. Heels visualized. Nasal
            bone visualized.
Targeted Anatomy

 Fetal Central Nervous System
 Cisterna Magna:
Cervix Uterus Adnexa

 Cervical Length:    4        cm

 Cervix:       Normal appearance by transabdominal scan.

 Left Ovary:    Not visualized today. Previously seen.
 Right Ovary:   Within normal limits.
 Adnexa:     No abnormality visualized.
Comments

 Reivewed OB history.  Patient diagnosed with FV Serolf
 mutation as well as protein C and S deficiency (reports
 diagnosis made while not pregnant and not on anticoagulant
 meds).  She has never had a venous thromboembolism.  She
 had elected to stop Lovinox.  Discussed potential risk to the
 patient by not taking Lovinox (DVT/ PE) that could be life-
 threatening. She was encouraged to resume taking Lovinox.
Impression

 Single IUP at 18 [DATE] weeks
 Normal anatomic fetal survey
 No markers associated with aneuploidy noted
 Normal amniotic fluid volume
Recommendations

 Recommend follow up in 4 weeks for interval growth.

## 2014-05-05 IMAGING — US US OB FOLLOW-UP
1 series · 12 of 28 positions shown · non-contrast
Comparison: none

[Series 1: us ob follow-up · 0.30mm/px · 12 of 33 slices shown]
[im 2/33]
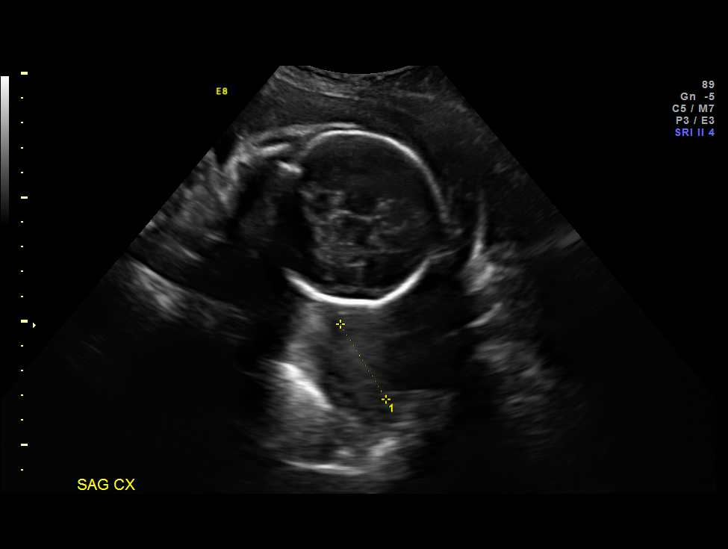
[im 4/33]
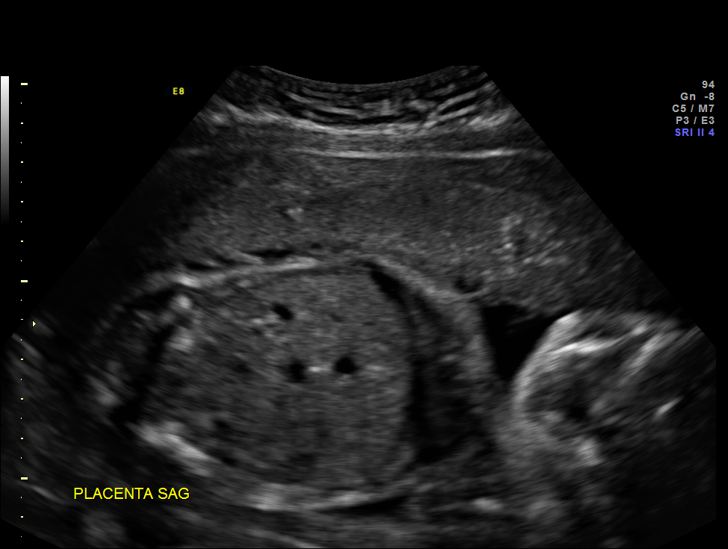
[im 6/33]
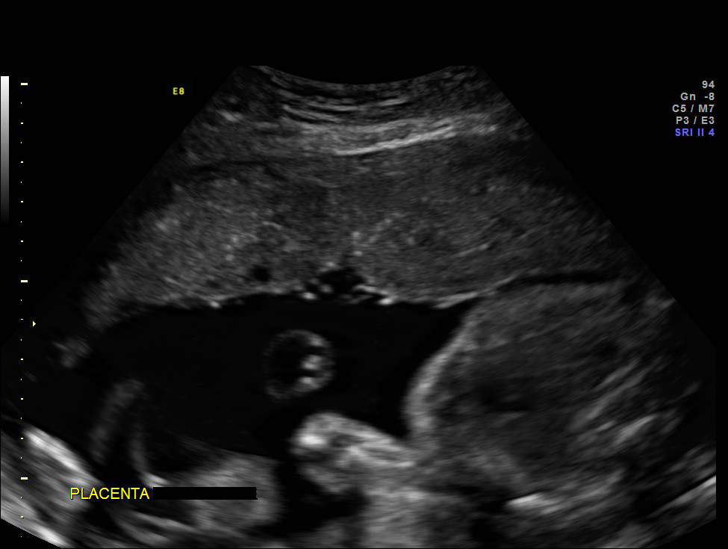
[im 10/33]
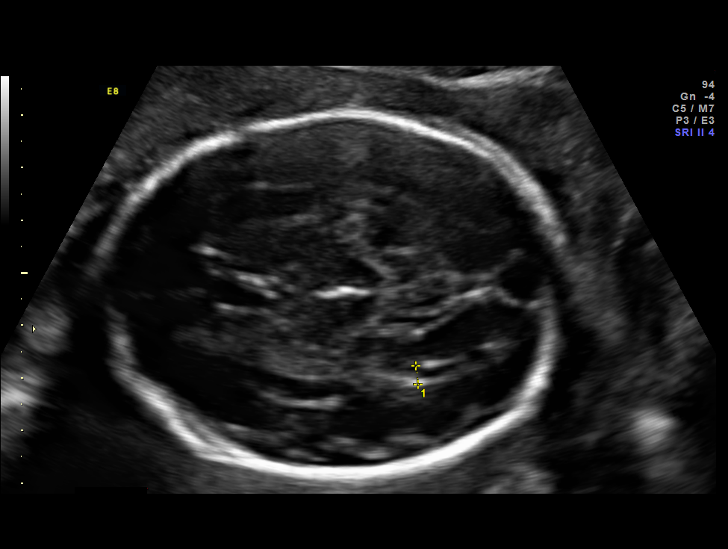
[im 12/33]
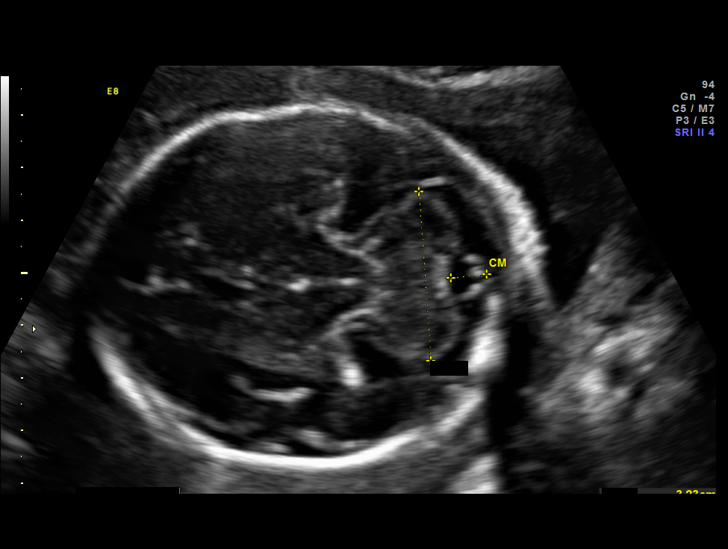
[im 15/33]
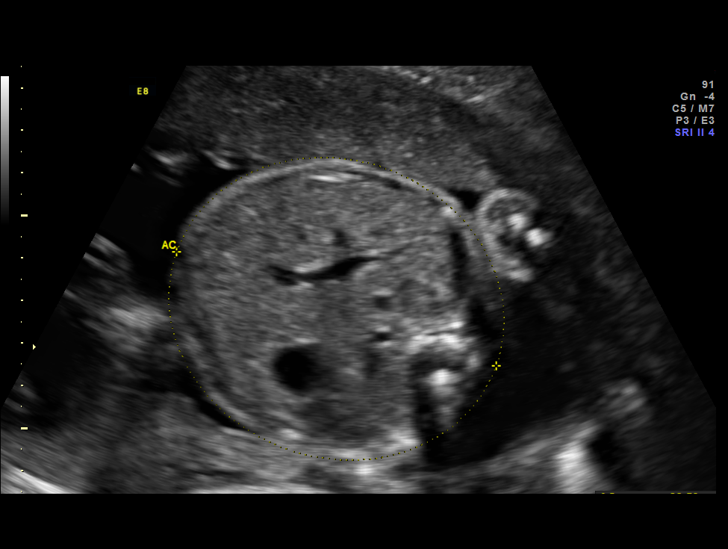
[im 18/33]
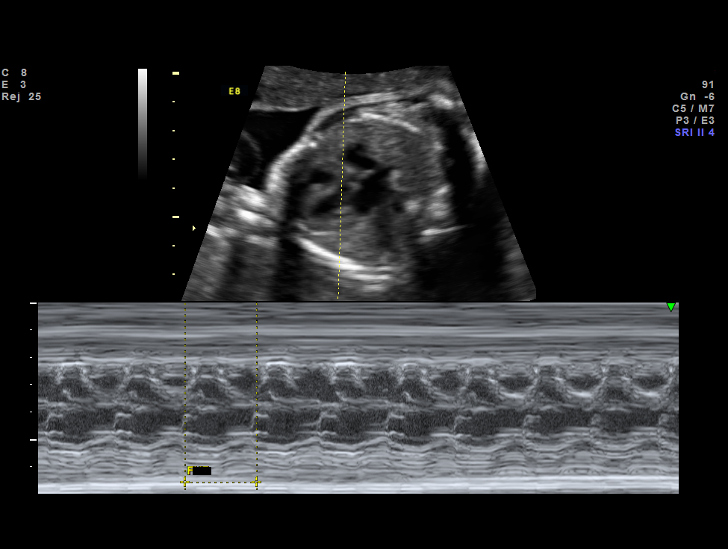
[im 21/33]
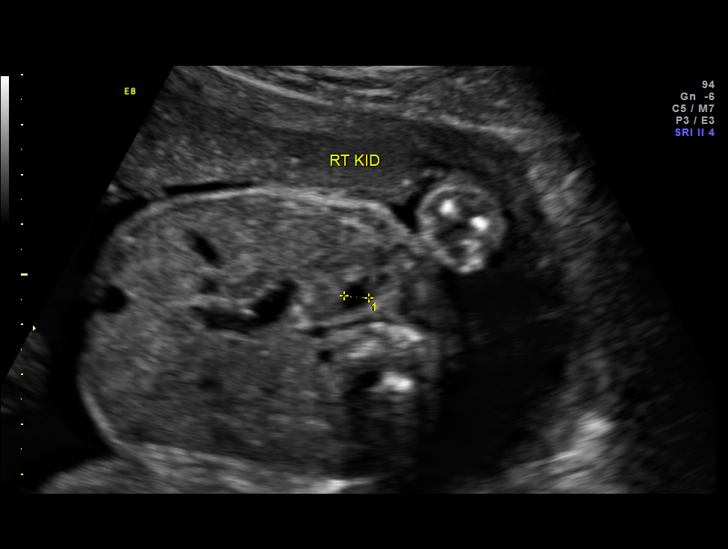
[im 23/33]
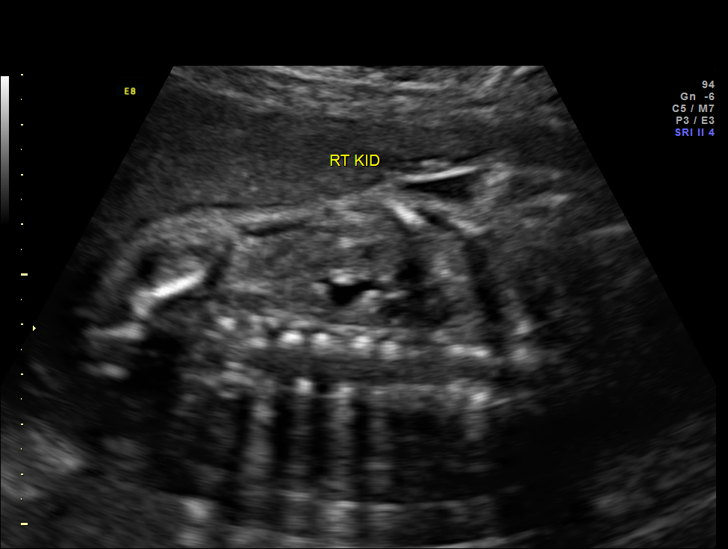
[im 27/33]
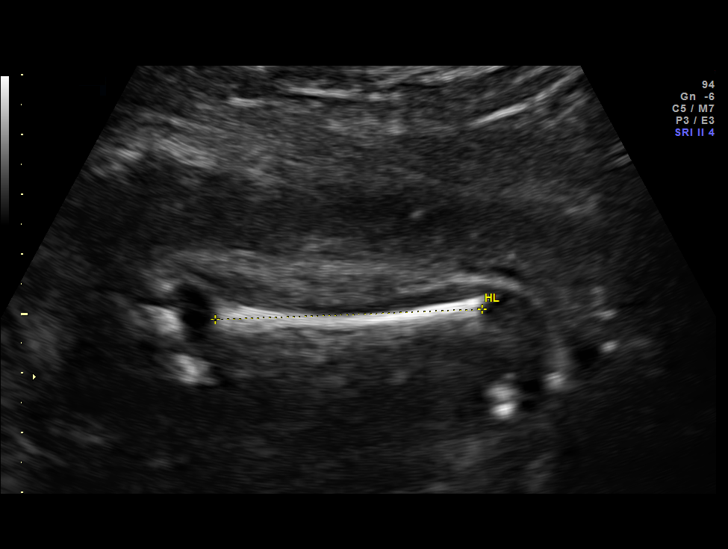
[im 29/33]
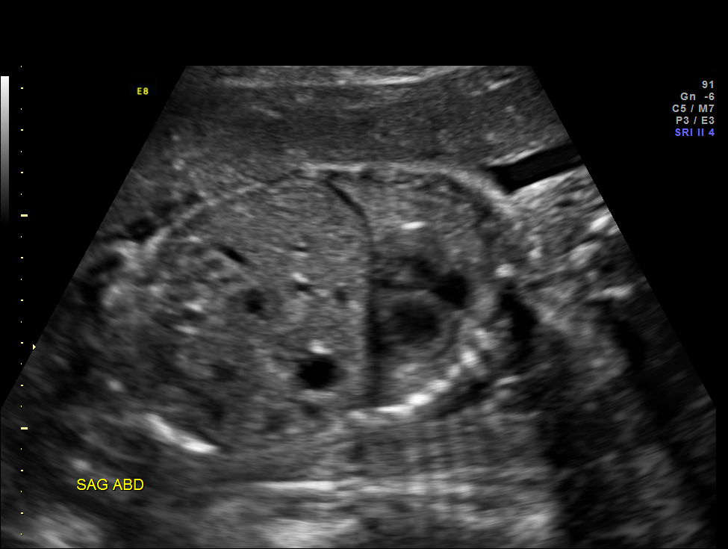
[im 31/33]
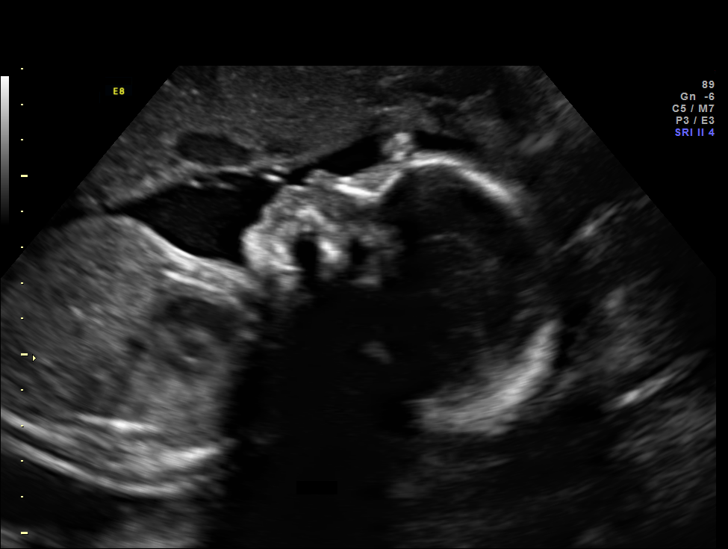

[12 of 28 positions shown; findings below may reference images not displayed]

OBSTETRICS REPORT
                      (Signed Final 09/11/2011 [DATE])

 Name:     BOGINIA SANDOR              Visit Date: 09/11/2011 [DATE]

 Order#:         [id number]
Procedures

 US OB FOLLOW UP                                       76816.1
Indications

 Poor obstetric history-Recurrent (habitual) abortion
 (3+ consecutive ab's)
 Factor V Leiden mutation
 RH Negative
 TIA
 Ehlers-Danlos syndrome
 Protein S deficiency
 Protein C deficiency
 Assess Fetal Growth / Estimated Fetal Weight
Fetal Evaluation

 Fetal Heart Rate:  141                          bpm
 Cardiac Activity:  Observed
 Presentation:      Cephalic
 Placenta:          Anterior, above cervical os
 P. Cord            Previously Visualized
 Insertion:

 Amniotic Fluid
 AFI FV:      Subjectively within normal limits
                                             Larg Pckt:     6.3  cm
Biometry

 BPD:     68.4  mm     G. Age:  27w 4d                CI:         76.1   70 - 86
 OFD:     89.9  mm                                    FL/HC:      19.5   18.6 -

 HC:     252.8  mm     G. Age:  27w 3d       27  %    HC/AC:      1.08   1.05 -

 AC:     235.1  mm     G. Age:  27w 6d       59  %    FL/BPD:     72.2   71 - 87
 FL:      49.4  mm     G. Age:  26w 5d       19  %    FL/AC:      21.0   20 - 24
 HUM:     44.9  mm     G. Age:  26w 4d       31  %
 CER:     32.3  mm     G. Age:  28w 2d       64  %

 Est. FW:    2266  gm      2 lb 6 oz     54  %
Gestational Age

 LMP:           27w 2d        Date:  03/04/11                 EDD:   12/09/11
 U/S Today:     27w 3d                                        EDD:   12/08/11
 Best:          27w 2d     Det. By:  LMP  (03/04/11)          EDD:   12/09/11
Anatomy

 Cranium:           Appears normal      Aortic Arch:       Previously seen
 Fetal Cavum:       Appears normal      Ductal Arch:       Previously seen
 Ventricles:        Appears normal      Diaphragm:         Previously seen
 Choroid Plexus:    Previously seen     Stomach:           Appears
                                                           normal, left
                                                           sided
 Cerebellum:        Previously seen     Abdomen:           Appears normal
 Posterior Fossa:   Previously seen     Abdominal Wall:    Previously seen
 Nuchal Fold:       Previously seen     Cord Vessels:      Previously seen
 Face:              Previously seen     Kidneys:           Appear normal
 Heart:             Appears normal      Bladder:           Appears normal
                    (4 chamber &
                    axis)
 RVOT:              Previously seen     Spine:             Previously seen
 LVOT:              Previously seen     Limbs:             Previously seen

 Other:     Fetus appears to be a female. Heels previously
            visualized. Nasal bone previously visualized.
Targeted Anatomy

 Fetal Central Nervous System
 Cisterna Magna:
Cervix Uterus Adnexa

 Cervical Length:    3.5      cm

 Cervix:       Normal appearance by transabdominal scan.
Impression

 Single living intrauterine pregnancy at 27 weeks 2 days.
 Appropriate interval fetal growth (54%).
 Normal amniotic fluid volume.
 Normal interval fetal anatomy.
Recommendations

 Recommend follow-up ultrasound examination in 4 weeks.

                Augustus, Pieke

## 2014-08-13 ENCOUNTER — Telehealth: Payer: Self-pay | Admitting: *Deleted

## 2014-08-13 DIAGNOSIS — T7840XA Allergy, unspecified, initial encounter: Secondary | ICD-10-CM

## 2014-08-13 MED ORDER — EPINEPHRINE 0.3 MG/0.3ML IJ SOAJ: 0.3000 mg | Freq: Once | INTRAMUSCULAR | Status: DC

## 2014-08-13 NOTE — Telephone Encounter (Signed)
Patient called and needed an Epi-Pen sent in for her allergies because she is going out of town and wanted to have it in case she needed to use it.  I have sent in to pharmacy.

## 2014-10-01 ENCOUNTER — Ambulatory Visit (INDEPENDENT_AMBULATORY_CARE_PROVIDER_SITE_OTHER): Payer: Commercial Managed Care - PPO | Admitting: Obstetrics & Gynecology

## 2014-10-01 ENCOUNTER — Other Ambulatory Visit (HOSPITAL_COMMUNITY): Admission: RE | Admit: 2014-10-01 | Payer: Self-pay | Source: Ambulatory Visit | Admitting: Obstetrics & Gynecology

## 2014-10-01 ENCOUNTER — Encounter: Payer: Self-pay | Admitting: Obstetrics & Gynecology

## 2014-10-01 VITALS — BP 118/84 | HR 99 | Resp 16 | Ht 62.0 in | Wt 143.0 lb

## 2014-10-01 DIAGNOSIS — A749 Chlamydial infection, unspecified: Secondary | ICD-10-CM | POA: Insufficient documentation

## 2014-10-01 DIAGNOSIS — Z113 Encounter for screening for infections with a predominantly sexual mode of transmission: Secondary | ICD-10-CM

## 2014-10-01 DIAGNOSIS — Z01419 Encounter for gynecological examination (general) (routine) without abnormal findings: Secondary | ICD-10-CM | POA: Diagnosis not present

## 2014-10-01 DIAGNOSIS — R87612 Low grade squamous intraepithelial lesion on cytologic smear of cervix (LGSIL): Secondary | ICD-10-CM

## 2014-10-01 DIAGNOSIS — Z1151 Encounter for screening for human papillomavirus (HPV): Secondary | ICD-10-CM

## 2014-10-01 DIAGNOSIS — Z124 Encounter for screening for malignant neoplasm of cervix: Secondary | ICD-10-CM

## 2014-10-01 DIAGNOSIS — Z0000 Encounter for general adult medical examination without abnormal findings: Secondary | ICD-10-CM

## 2014-10-01 HISTORY — DX: Chlamydial infection, unspecified: A74.9

## 2014-10-01 HISTORY — DX: Low grade squamous intraepithelial lesion on cytologic smear of cervix (LGSIL): R87.612

## 2014-10-01 LAB — RPR

## 2014-10-01 NOTE — Progress Notes (Signed)
Subjective:    Regina Butler is a 29 y.o. separated W female who presents for an annual exam. The patient has no complaints today. The patient is sexually active. GYN screening history: last pap: was normal. The patient wears seatbelts: yes. The patient participates in regular exercise: yes. Has the patient ever been transfused or tattooed?: yes. The patient reports that there is not domestic violence in her life.   Menstrual History: OB History    Gravida Para Term Preterm AB TAB SAB Ectopic Multiple Living   0 12 0 12 0 0 2      Menarche age: 4  Patient's last menstrual period was 09/06/2014.    The following portions of the patient's history were reviewed and updated as appropriate: allergies, current medications, past family history, past medical history, past social history, past surgical history and problem list.  Review of Systems A comprehensive review of systems was negative.    Objective:    BP 118/84 mmHg  Pulse 99  Resp 16  Ht  (1.575 m)  Wt 143 lb (64.864 kg)  BMI 26.15 kg/m2  LMP 09/06/2014  General Appearance:    Alert, cooperative, no distress, appears stated age  Head:    Normocephalic, without obvious abnormality, atraumatic  Eyes:    PERRL, conjunctiva/corneas clear, EOM's intact, fundi    benign, both eyes  Ears:    Normal TM's and external ear canals, both ears  Nose:   Nares normal, septum midline, mucosa normal, no drainage    or sinus tenderness  Throat:   Lips, mucosa, and tongue normal; teeth and gums normal  Neck:   Supple, symmetrical, trachea midline, no adenopathy;    thyroid:  no enlargement/tenderness/nodules; no carotid   bruit or JVD  Back:     Symmetric, no curvature, ROM normal, no CVA tenderness  Lungs:     Clear to auscultation bilaterally, respirations unlabored  Chest Wall:    No tenderness or deformity   Heart:    Regular rate and rhythm, S1 and S2 normal, no murmur, rub   or gallop  Breast Exam:    No tenderness,  masses, or nipple abnormality  Abdomen:     Soft, non-tender, bowel sounds active all four quadrants,    no masses, no organomegaly  Genitalia:    Normal female without lesion, discharge or tenderness, NSSA, NT, mobile, normal adnexal exam     Extremities:   Extremities normal, atraumatic, no cyanosis or edema  Pulses:   2+ and symmetric all extremities  Skin:   Skin color, texture, turgor normal, no rashes or lesions  Lymph nodes:   Cervical, supraclavicular, and axillary nodes normal  Neurologic:   CNII-XII intact, normal strength, sensation and reflexes    throughout  .    Assessment:    Healthy female exam.    Plan:     Breast self exam technique reviewed and patient encouraged to perform self-exam monthly. Thin prep Pap smear. STI testing

## 2014-10-01 NOTE — Progress Notes (Signed)
Pt here for routine GYN. States she has had recurrent yeast infections since last year. Last pap was 09/2013 normal - no cotesting added to pap. Pt c/o urinary frequency and had "O" shot at [facility] in [place], [place] in April 2016 that helped with frequency but thinks it has "worn off". She would like to know if there is a way we can order this and insurance pay for it.

## 2014-10-02 LAB — GC/CHLAMYDIA PROBE AMP
CT PROBE, AMP APTIMA: POSITIVE — AB
GC Probe RNA: NEGATIVE

## 2014-10-02 LAB — HIV ANTIBODY (ROUTINE TESTING W REFLEX): HIV: NONREACTIVE

## 2014-10-02 LAB — HEPATITIS C ANTIBODY: HCV Ab: NEGATIVE

## 2014-10-02 LAB — HEPATITIS B SURFACE ANTIGEN: Hepatitis B Surface Ag: NEGATIVE

## 2014-10-04 ENCOUNTER — Telehealth: Payer: Self-pay | Admitting: *Deleted

## 2014-10-04 DIAGNOSIS — A749 Chlamydial infection, unspecified: Secondary | ICD-10-CM

## 2014-10-04 LAB — CYTOLOGY - PAP

## 2014-10-04 MED ORDER — AZITHROMYCIN 500 MG PO TABS: 1000.0000 mg | ORAL_TABLET | Freq: Once | ORAL | Status: DC

## 2014-10-04 NOTE — Telephone Encounter (Signed)
Informed pt of + CT result, sent rx to pharmacy and informed pt that partner needs to be treated and to refrain from intercourse until he is treated. Pt will also need to schedule a follow-up in 4 weeks for TOC

## 2014-10-18 ENCOUNTER — Telehealth: Payer: Self-pay | Admitting: *Deleted

## 2014-10-18 NOTE — Telephone Encounter (Signed)
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 She will need a colpo when she gets her TOC next month. Thanks 

## 2014-10-18 NOTE — Telephone Encounter (Signed)
Informed pt of pap results and scheduled Colpo appt for 11-02-14 at 915.

## 2014-10-18 NOTE — Telephone Encounter (Signed)
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 She will need a colpo when she gets her TOC next month. Thanks

## 2014-10-18 NOTE — Telephone Encounter (Signed)
Called pt, no answer, LM for her to call office.

## 2014-11-02 ENCOUNTER — Encounter: Payer: Commercial Managed Care - PPO | Admitting: Obstetrics & Gynecology

## 2014-11-02 ENCOUNTER — Ambulatory Visit: Payer: Commercial Managed Care - PPO | Admitting: Obstetrics & Gynecology

## 2014-11-10 ENCOUNTER — Ambulatory Visit (INDEPENDENT_AMBULATORY_CARE_PROVIDER_SITE_OTHER): Payer: Commercial Managed Care - PPO | Admitting: Obstetrics & Gynecology

## 2014-11-10 ENCOUNTER — Encounter: Payer: Self-pay | Admitting: Obstetrics & Gynecology

## 2014-11-10 VITALS — BP 127/84 | HR 109 | Wt 140.0 lb

## 2014-11-10 DIAGNOSIS — R87612 Low grade squamous intraepithelial lesion on cytologic smear of cervix (LGSIL): Secondary | ICD-10-CM

## 2014-11-10 DIAGNOSIS — Z113 Encounter for screening for infections with a predominantly sexual mode of transmission: Secondary | ICD-10-CM

## 2014-11-10 DIAGNOSIS — Z01812 Encounter for preprocedural laboratory examination: Secondary | ICD-10-CM

## 2014-11-10 DIAGNOSIS — A749 Chlamydial infection, unspecified: Secondary | ICD-10-CM

## 2014-11-10 DIAGNOSIS — R87810 Cervical high risk human papillomavirus (HPV) DNA test positive: Secondary | ICD-10-CM | POA: Diagnosis not present

## 2014-11-10 LAB — POCT URINE PREGNANCY: Preg Test, Ur: NEGATIVE

## 2014-11-10 NOTE — Progress Notes (Signed)
    GYNECOLOGY CLINIC COLPOSCOPY PROCEDURE NOTE  29 y.o. [id number] here for colposcopy for low-grade squamous intraepithelial neoplasia (LGSIL - encompassing HPV,mild dysplasia,CIN I) pap smear on 10/01/14. Discussed role for HPV in cervical dysplasia, need for surveillance. Patient also had positive Chlamydia test on 10/01/14 that was treated, test of cure to be done today.  Patient given informed consent, signed copy in the chart, time out was performed.  Placed in lithotomy position. Cervix viewed with speculum and colposcope after application of acetic acid.   Colposcopy adequate? Yes Acetowhite lesion(s), ?mosaicism noted at 12 o'clock; corresponding biopsies obtained.  ECC specimen obtained. All specimens were labelled and sent to pathology.  Patient was given post procedure instructions.  Will follow up pathology and manage accordingly. Test of cure for chlamydia obtained; will follow up results and manage accordingly.  Routine preventative health maintenance measures emphasized.    Jaynie Collins, MD, FACOG Attending Obstetrician & Gynecologist,  Medical Group [facility]

## 2014-11-10 NOTE — Patient Instructions (Signed)
COLPOSCOPY POST-PROCEDURE INSTRUCTIONS  1. You may take Ibuprofen, Aleve or Tylenol for cramping if needed.  2. If Monsel's solution was used, you will have a black discharge.  3. Light bleeding is normal.  If bleeding is heavier than your period, please call.  4. Put nothing in your vagina until the bleeding or discharge stops (usually 2 or3 days).  5. We will call you within one week with biopsy results.  Colposcopy, Care After Refer to this sheet in the next few weeks. These instructions provide you with information on caring for yourself after your procedure. Your health care provider may also give you more specific instructions. Your treatment has been planned according to current medical practices, but problems sometimes occur. Call your health care provider if you have any problems or questions after your procedure. WHAT TO EXPECT AFTER THE PROCEDURE  After your procedure, it is typical to have the following:  Cramping. This often goes away in a few minutes.  Soreness. This may last for 2 days.  Lightheadedness. Lie down for a few minutes if this occurs. You may also have some bleeding or dark discharge for a few days. You may need to wear a sanitary pad during this time. HOME CARE INSTRUCTIONS  Avoid sex, douching, and using tampons for 3 days or as directed by your health care provider.  Only take over-the-counter or prescription medicines as directed by your health care provider. Do not take aspirin because it can cause bleeding.  Continue to take birth control pills if you are on them.  Not all test results are available during your visit.You will be contacted with the results.  Follow your health care provider's advice regarding activity, follow-up visits, and follow-up Pap tests. SEEK MEDICAL CARE IF:  You develop a rash.  You have problems with your medicine. SEEK IMMEDIATE MEDICAL CARE IF:  You are bleeding heavily or are passing blood clots.  You have a  fever.  You have abnormal vaginal discharge.  You are having cramps that do not go away after taking your pain medicine.  You feel lightheaded, dizzy, or faint.  You have stomach pain.   This information is not intended to replace advice given to you by your health care provider. Make sure you discuss any questions you have with your health care provider.   Document Released: 11/12/2012 Document Reviewed: 11/12/2012 Elsevier Interactive Patient Education [employer]

## 2014-11-11 ENCOUNTER — Encounter: Payer: Self-pay | Admitting: Obstetrics & Gynecology

## 2014-11-11 ENCOUNTER — Encounter: Payer: Self-pay | Admitting: *Deleted

## 2014-11-11 LAB — GC/CHLAMYDIA PROBE AMP (~~LOC~~) NOT AT ARMC
Chlamydia: NEGATIVE
NEISSERIA GONORRHEA: NEGATIVE

## 2014-11-15 ENCOUNTER — Telehealth: Payer: Self-pay | Admitting: *Deleted

## 2014-11-15 NOTE — Telephone Encounter (Signed)
Spoke to pt about colposcopy results and recommendation to follow-up with pap smear in one year.  Pt acknowledged instructions and will call with any concerns.

## 2014-11-15 NOTE — Telephone Encounter (Signed)
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 Repeat pap smear in one year. Please call to inform patient of results and recommendations. Results were also released to MyChart and patient was given recommendations as indicated.

## 2015-01-26 ENCOUNTER — Encounter: Payer: Self-pay | Admitting: Certified Nurse Midwife

## 2015-01-26 ENCOUNTER — Ambulatory Visit (INDEPENDENT_AMBULATORY_CARE_PROVIDER_SITE_OTHER): Payer: Commercial Managed Care - PPO | Admitting: Certified Nurse Midwife

## 2015-01-26 VITALS — BP 128/84 | HR 105 | Wt 142.0 lb

## 2015-01-26 DIAGNOSIS — Z202 Contact with and (suspected) exposure to infections with a predominantly sexual mode of transmission: Secondary | ICD-10-CM

## 2015-01-26 DIAGNOSIS — Z113 Encounter for screening for infections with a predominantly sexual mode of transmission: Secondary | ICD-10-CM | POA: Diagnosis not present

## 2015-01-26 MED ORDER — METRONIDAZOLE 500 MG PO TABS: 500.0000 mg | ORAL_TABLET | Freq: Two times a day (BID) | ORAL | Status: DC

## 2015-01-26 NOTE — Addendum Note (Signed)
Addended by: Gita KudoLASSITER, KRISTEN S on: 01/26/2015 02:25 PM   Modules accepted: Orders

## 2015-01-26 NOTE — Progress Notes (Signed)
Patient ID: Regina GuarneriAshleigh A Butler, female   DOB: 06/16/1985, 29 y.o.   MRN: [id number] SUBJECTIVE:  29 y.o. female complains of curd-like vaginal discharge for 7 day(s). Denies abnormal vaginal bleeding or significant pelvic pain or fever. No UTI symptoms.Possible Exsposure to STD. Requests testing but declines HIV testing.  Patient's last menstrual period was 01/10/2015.  OBJECTIVE:  She appears well, afebrile. Abdomen: benign, soft, nontender, no masses. Pelvic Exam: normal external genitalia, vulva, vagina, cervix, uterus and adnexa, VULVA: normal appearing vulva with no masses, tenderness or lesions, VAGINA: normal appearing vagina with normal color and discharge, no lesions, vaginal discharge - clear, CERVIX: normal appearing cervix without discharge or lesions, UTERUS: uterus is normal size, shape, consistency and nontender, ADNEXA: normal adnexa in size, nontender and no masses. Urine dipstick: not done.  ASSESSMENT:  rule out GC or chlamydia and nonspecific vaginitis  PLAN:  GC and chlamydia DNA  probe sent to lab. Treatment: Flagyl 500 BID x 7 days and abstain from coitus during course of treatment ROV prn if symptoms persist or worsen.

## 2015-01-26 NOTE — Patient Instructions (Signed)

## 2015-01-27 LAB — GC/CHLAMYDIA PROBE AMP (~~LOC~~) NOT AT ARMC
Chlamydia: NEGATIVE
Neisseria Gonorrhea: NEGATIVE

## 2015-01-27 LAB — HEPATITIS B SURFACE ANTIGEN: Hepatitis B Surface Ag: NEGATIVE

## 2015-01-27 LAB — WET PREP, GENITAL
Trich, Wet Prep: NONE SEEN
Yeast Wet Prep HPF POC: NONE SEEN

## 2015-01-27 LAB — RPR

## 2015-05-25 ENCOUNTER — Encounter: Payer: Self-pay | Admitting: Certified Nurse Midwife

## 2015-05-25 ENCOUNTER — Ambulatory Visit (INDEPENDENT_AMBULATORY_CARE_PROVIDER_SITE_OTHER): Payer: Commercial Managed Care - PPO | Admitting: Certified Nurse Midwife

## 2015-05-25 VITALS — BP 107/76 | HR 106 | Resp 16 | Ht 61.0 in | Wt 140.0 lb

## 2015-05-25 DIAGNOSIS — N898 Other specified noninflammatory disorders of vagina: Secondary | ICD-10-CM | POA: Diagnosis not present

## 2015-05-25 DIAGNOSIS — Z113 Encounter for screening for infections with a predominantly sexual mode of transmission: Secondary | ICD-10-CM

## 2015-05-25 MED ORDER — METRONIDAZOLE 0.75 % VA GEL: 1.0000 | Freq: Every day | VAGINAL | Status: DC

## 2015-05-25 NOTE — Patient Instructions (Signed)

## 2015-05-25 NOTE — Progress Notes (Signed)
Patient ID: Regina Butler, female   DOB: 01/18/1986, 30 y.o.   MRN: [id number] Patient ID: Regina Butler, female DOB: 11/12/1985, 30 y.o. MRN: [id number] SUBJECTIVE:  30 y.o. female complains of curd-like vaginal discharge for 7 day(s). Denies abnormal vaginal bleeding or significant pelvic pain or fever. No UTI symptoms.Possible Exsposure to STD. Requests testing but declines HIV testing.    OBJECTIVE:  She appears well, afebrile. Abdomen: benign, soft, nontender, no masses. Pelvic Exam: normal external genitalia, vulva, vagina, cervix, uterus and adnexa, VULVA: normal appearing vulva with no masses, tenderness or lesions, VAGINA: normal appearing vagina with normal color and discharge, no lesions, vaginal discharge - clear, CERVIX: normal appearing cervix without discharge or lesions, UTERUS: uterus is normal size, shape, consistency and nontender, ADNEXA: normal adnexa in size, nontender and no masses. Urine dipstick: not done.  ASSESSMENT:  rule out GC or chlamydia and nonspecific vaginitis  PLAN:  GC and chlamydia DNA probe sent to lab. Wet prpe Treatment: Metrogel prophylactic  ROV prn if symptoms persist or worsen.

## 2015-05-25 NOTE — Addendum Note (Signed)
Addended by: Arne ClevelandHUTCHINSON, MANDY J on: 05/25/2015 02:52 PM   Modules accepted: Orders

## 2015-05-26 LAB — GC/CHLAMYDIA PROBE AMP (~~LOC~~) NOT AT ARMC
Chlamydia: NEGATIVE
Neisseria Gonorrhea: NEGATIVE

## 2015-05-26 LAB — WET PREP BY MOLECULAR PROBE
Candida species: NEGATIVE
Gardnerella vaginalis: POSITIVE — AB
Trichomonas vaginosis: NEGATIVE

## 2015-10-18 ENCOUNTER — Other Ambulatory Visit: Payer: Self-pay | Admitting: Obstetrics & Gynecology

## 2015-10-18 DIAGNOSIS — T7840XA Allergy, unspecified, initial encounter: Secondary | ICD-10-CM

## 2016-02-27 ENCOUNTER — Encounter: Payer: Self-pay | Admitting: Family Medicine

## 2016-02-27 ENCOUNTER — Ambulatory Visit (INDEPENDENT_AMBULATORY_CARE_PROVIDER_SITE_OTHER): Payer: Commercial Managed Care - PPO | Admitting: Family Medicine

## 2016-02-27 VITALS — BP 102/70 | HR 92 | Resp 18 | Ht 62.0 in | Wt 132.0 lb

## 2016-02-27 DIAGNOSIS — Z1151 Encounter for screening for human papillomavirus (HPV): Secondary | ICD-10-CM | POA: Diagnosis not present

## 2016-02-27 DIAGNOSIS — B9689 Other specified bacterial agents as the cause of diseases classified elsewhere: Secondary | ICD-10-CM | POA: Diagnosis not present

## 2016-02-27 DIAGNOSIS — N760 Acute vaginitis: Secondary | ICD-10-CM | POA: Diagnosis not present

## 2016-02-27 DIAGNOSIS — N898 Other specified noninflammatory disorders of vagina: Secondary | ICD-10-CM | POA: Diagnosis not present

## 2016-02-27 DIAGNOSIS — Z124 Encounter for screening for malignant neoplasm of cervix: Secondary | ICD-10-CM

## 2016-02-27 DIAGNOSIS — Z113 Encounter for screening for infections with a predominantly sexual mode of transmission: Secondary | ICD-10-CM | POA: Diagnosis not present

## 2016-02-27 MED ORDER — METRONIDAZOLE 0.75 % VA GEL: 1.0000 | Freq: Every day | VAGINAL | 1 refills | Status: DC

## 2016-02-27 NOTE — Progress Notes (Signed)
   Subjective:    Patient ID: Regina Butler is a 31 y.o. female presenting with Vaginal Odor  on 02/27/2016  HPI: Has new sexual partner. Reports vaginal odor and discharge. Has h/o frequent bouts of BV. Likes to use 10 day course of Metrogel, then use 2x/wk if needed. Symptoms feel similar. Last pap was mildly abnormal, but is > 32 year old--needs repeat.  Review of Systems  Constitutional: Negative for chills and fever.  Respiratory: Negative for shortness of breath.   Cardiovascular: Negative for chest pain.  Gastrointestinal: Negative for abdominal pain, nausea and vomiting.  Genitourinary: Positive for vaginal discharge and vaginal pain. Negative for dysuria.  Skin: Negative for rash.      Objective:    BP 102/70 (BP Location: Left Arm, Patient Position: Sitting, Cuff Size: Normal)   Pulse 92   Resp 18   Ht 5\' 2"  (1.575 m)   Wt 132 lb (59.9 kg)   LMP 02/15/2016   BMI 24.14 kg/m  Physical Exam  Constitutional: She is oriented to person, place, and time. She appears well-developed and well-nourished. No distress.  HENT:  Head: Normocephalic and atraumatic.  Eyes: No scleral icterus.  Neck: Neck supple.  Cardiovascular: Normal rate.   Pulmonary/Chest: Effort normal.  Abdominal: Soft.  Genitourinary:  Genitourinary Comments: Thick yellow discharge noted.  Neurological: She is alert and oriented to person, place, and time.  Skin: Skin is warm and dry.  Psychiatric: She has a normal mood and affect.        Assessment & Plan:   Problem List Items Addressed This Visit    None    Visit Diagnoses    Bacterial vaginosis    -  Primary   Relevant Medications   metroNIDAZOLE (METROGEL) 0.75 % vaginal gel   Other Relevant Orders   Cervicovaginal ancillary only   Screening for cervical cancer       Relevant Orders   Cytology - PAP      Total face-to-face time with patient: 15 minutes. Over 50% of encounter was spent on counseling and coordination of care. Return  in about 4 weeks (around 03/26/2016), or if symptoms worsen or fail to improve, for repeat pap.  Reva Boresanya S Beryle Bagsby 02/27/2016 3:28 PM

## 2016-02-27 NOTE — Patient Instructions (Signed)

## 2016-02-28 LAB — CERVICOVAGINAL ANCILLARY ONLY
Bacterial vaginitis: POSITIVE — AB
Candida vaginitis: POSITIVE — AB
TRICH (WINDOWPATH): NEGATIVE

## 2016-02-29 LAB — CYTOLOGY - PAP
Diagnosis: NEGATIVE
HPV (WINDOPATH): NOT DETECTED

## 2016-05-01 ENCOUNTER — Telehealth: Payer: Self-pay | Admitting: *Deleted

## 2016-05-01 DIAGNOSIS — N760 Acute vaginitis: Principal | ICD-10-CM

## 2016-05-01 DIAGNOSIS — B9689 Other specified bacterial agents as the cause of diseases classified elsewhere: Secondary | ICD-10-CM

## 2016-05-01 MED ORDER — TINIDAZOLE 500 MG PO TABS: 2.0000 g | ORAL_TABLET | Freq: Every day | ORAL | 0 refills | Status: DC

## 2016-05-01 NOTE — Telephone Encounter (Signed)
Pt called c/o recurrent BV, has tried Flagyl by mouth and Metrogel.  Was wanting to know if there was anything else to try.  Informed her that she could try Tindamax this time and see if that would help.  Instructed to call back after treatment if symptoms persist.

## 2017-11-25 ENCOUNTER — Ambulatory Visit: Payer: Commercial Managed Care - PPO | Admitting: Obstetrics and Gynecology

## 2017-11-25 ENCOUNTER — Ambulatory Visit: Payer: Commercial Managed Care - PPO | Admitting: Obstetrics & Gynecology

## 2017-12-03 ENCOUNTER — Emergency Department: Payer: Commercial Managed Care - PPO

## 2017-12-03 ENCOUNTER — Encounter: Payer: Self-pay | Admitting: Certified Nurse Midwife

## 2017-12-03 ENCOUNTER — Encounter: Payer: Self-pay | Admitting: Emergency Medicine

## 2017-12-03 ENCOUNTER — Emergency Department
Admission: EM | Admit: 2017-12-03 | Discharge: 2017-12-03 | Disposition: A | Discharge status: Home / Self Care | Payer: Commercial Managed Care - PPO | Attending: Emergency Medicine | Admitting: Emergency Medicine

## 2017-12-03 DIAGNOSIS — F17210 Nicotine dependence, cigarettes, uncomplicated: Secondary | ICD-10-CM | POA: Insufficient documentation

## 2017-12-03 DIAGNOSIS — S0083XA Contusion of other part of head, initial encounter: Secondary | ICD-10-CM | POA: Diagnosis not present

## 2017-12-03 DIAGNOSIS — Y9301 Activity, walking, marching and hiking: Secondary | ICD-10-CM | POA: Diagnosis not present

## 2017-12-03 DIAGNOSIS — Y929 Unspecified place or not applicable: Secondary | ICD-10-CM | POA: Insufficient documentation

## 2017-12-03 DIAGNOSIS — Z8673 Personal history of transient ischemic attack (TIA), and cerebral infarction without residual deficits: Secondary | ICD-10-CM | POA: Diagnosis not present

## 2017-12-03 DIAGNOSIS — S060X1A Concussion with loss of consciousness of 30 minutes or less, initial encounter: Secondary | ICD-10-CM

## 2017-12-03 DIAGNOSIS — M25511 Pain in right shoulder: Secondary | ICD-10-CM | POA: Insufficient documentation

## 2017-12-03 DIAGNOSIS — R0789 Other chest pain: Secondary | ICD-10-CM | POA: Diagnosis not present

## 2017-12-03 DIAGNOSIS — Y998 Other external cause status: Secondary | ICD-10-CM | POA: Diagnosis not present

## 2017-12-03 DIAGNOSIS — W19XXXA Unspecified fall, initial encounter: Secondary | ICD-10-CM

## 2017-12-03 DIAGNOSIS — R220 Localized swelling, mass and lump, head: Secondary | ICD-10-CM | POA: Insufficient documentation

## 2017-12-03 DIAGNOSIS — W108XXA Fall (on) (from) other stairs and steps, initial encounter: Secondary | ICD-10-CM | POA: Insufficient documentation

## 2017-12-03 DIAGNOSIS — S0990XA Unspecified injury of head, initial encounter: Secondary | ICD-10-CM | POA: Diagnosis present

## 2017-12-03 LAB — BASIC METABOLIC PANEL
ANION GAP: 11 (ref 5–15)
BUN: 10 mg/dL (ref 6–20)
CALCIUM: 9.5 mg/dL (ref 8.9–10.3)
CO2: 23 mmol/L (ref 22–32)
Chloride: 102 mmol/L (ref 98–111)
Creatinine, Ser: 0.72 mg/dL (ref 0.44–1.00)
Glucose, Bld: 91 mg/dL (ref 70–99)
POTASSIUM: 4 mmol/L (ref 3.5–5.1)
SODIUM: 136 mmol/L (ref 135–145)

## 2017-12-03 LAB — CBC
HCT: 45.8 % (ref 36.0–46.0)
HEMOGLOBIN: 15.7 g/dL — AB (ref 12.0–15.0)
MCH: 32 pg (ref 26.0–34.0)
MCHC: 34.3 g/dL (ref 30.0–36.0)
MCV: 93.3 fL (ref 80.0–100.0)
NRBC: 0 % (ref 0.0–0.2)
Platelets: 192 10*3/uL (ref 150–400)
RBC: 4.91 MIL/uL (ref 3.87–5.11)
RDW: 12.7 % (ref 11.5–15.5)
WBC: 12.9 10*3/uL — AB (ref 4.0–10.5)

## 2017-12-03 LAB — POCT PREGNANCY, URINE: PREG TEST UR: NEGATIVE

## 2017-12-03 LAB — TROPONIN I

## 2017-12-03 MED ORDER — HYDROMORPHONE HCL 1 MG/ML IJ SOLN
1.0000 mg | Freq: Once | INTRAMUSCULAR | Status: AC
  Administered 2017-12-03: 1 mg via INTRAMUSCULAR
  Filled 2017-12-03: qty 1

## 2017-12-03 MED ORDER — OXYCODONE-ACETAMINOPHEN 5-325 MG PO TABS
1.0000 | ORAL_TABLET | ORAL | Status: AC | PRN
  Administered 2017-12-03 (×2): 1 via ORAL
  Filled 2017-12-03 (×2): qty 1

## 2017-12-03 MED ORDER — OXYCODONE-ACETAMINOPHEN 5-325 MG PO TABS: 1.0000 | ORAL_TABLET | Freq: Three times a day (TID) | ORAL | 0 refills | Status: AC | PRN

## 2017-12-03 MED ORDER — DIAZEPAM 5 MG PO TABS
10.0000 mg | ORAL_TABLET | Freq: Once | ORAL | Status: AC
  Administered 2017-12-03: 10 mg via ORAL
  Filled 2017-12-03: qty 2

## 2017-12-03 NOTE — ED Notes (Signed)
FIRST NURSE NOTE: PT tearful, states she fell down the stairs this morning, has bandaid to forehead, pt states "everything hurts"

## 2017-12-03 NOTE — ED Triage Notes (Signed)
Pt had mechanical fall that caused pt to fall down 10 tile stair and hit her head on wooden post. Pt unsure if she lost consciousness. Pt has swelling to right side of face. Pt reports headache, face pain, right shoulder pain, right ribpain, left hand, and left foot.

## 2017-12-03 NOTE — ED Notes (Signed)
Pt. States she is calling an uber since she had percocet.

## 2017-12-03 NOTE — ED Provider Notes (Signed)
[facility] Emergency Department Provider Note       Time seen: ----------------------------------------- 7:19 PM on 12/03/2017 -----------------------------------------   I have reviewed the triage vital signs and the nursing notes.  HISTORY   Chief Complaint Fall    HPI Regina Butler is a 32 y.o. female with a history of atherosclerotic vascular disease, Ehlers-Danlos syndrome, factor V deficiency, protein CNS deficiency who presents to the ED for a fall.  Patient had a mechanical fall that caused her to fall down 10 tile stairs and hit her head on a wooden post.  Patient was unsure if she lost consciousness, describes swelling to the right side of her face with headache, facial pain, shoulder pain and right rib pain.  Past Medical History:  Diagnosis Date  . Chlamydia infection 10/01/2014   Treated on 10/01/14. Negative TOC on 11/11/14.   . Class III atherosclerotic vascular disease   . Complication of anesthesia   . CVA (cerebral vascular accident) (HCC) 07/2007   facial droop/paralysis  . Ehler's-Danlos syndrome   . Factor V deficiency (HCC)    heterozygote  . Low grade squamous intraepithelial lesion (LGSIL) pap smear 10/01/2014   Colposcopy done 11/10/14  . Personal history of TIA (transient ischemic attack) 07/2007   facial droop/paralysis  . Protein C deficiency complicating pregnancy (HCC)   . Protein S deficiency complicating pregnancy (HCC)   . Rh negative status during pregnancy   . SAB (spontaneous abortion)    11 previous sab  . Thrombophilia [facility])     Patient Active Problem List   Diagnosis Date Noted  . Low grade squamous intraepithelial lesion (LGSIL) pap smear 10/01/2014  . Depression 02/17/2014  . FACTOR V LEIDEN 08/04/2008  . Primary hypercoagulable state (HCC) 08/04/2008  . CEREBROVASCULAR ACCIDENT 08/04/2008  . Phlebitis and thrombophlebitis of other site 08/04/2008  . Headache(784.0) 08/04/2008    Past Surgical  History:  Procedure Laterality Date  . DILATION AND CURETTAGE OF UTERUS    . SHOULDER SURGERY      Allergies Coconut fatty acids; Amoxicillin; Estrogens; Naprosyn [naproxen]; and Penicillins  Social History Social History   Tobacco Use  . Smoking status: Current Every Day Smoker    Packs/day: 0.50    Types: Cigarettes  . Smokeless tobacco: Never Used  Substance Use Topics  . Alcohol use: Yes    Alcohol/week: 1.0 standard drinks    Types: 1 Glasses of wine per week  . Drug use: No   Review of Systems Constitutional: Negative for fever. ENT: Positive for facial pain Cardiovascular: Negative for chest pain. Respiratory: Negative for shortness of breath. Gastrointestinal: Negative for abdominal pain, vomiting and diarrhea. Musculoskeletal: Positive for right shoulder, right rib, left hand and left foot pain Skin: Negative for rash. Neurological: Positive for headache  All systems negative/normal/unremarkable except as stated in the HPI  ____________________________________________   PHYSICAL EXAM:  VITAL SIGNS: ED Triage Vitals [12/03/17 1711]  Enc Vitals Group     BP 123/89     Pulse Rate (!) 121     Resp (!) 24     Temp 99 F (37.2 C)     Temp Source Oral     SpO2 98 %     Weight 118 lb (53.5 kg)     Height 5\' 1"  (1.549 m)     Head Circumference      Peak Flow      Pain Score 8     Pain Loc      Pain  Edu?      Excl. in GC?    Constitutional: Alert and oriented.  Anxious, mild distress Eyes: Conjunctivae are normal. Normal extraocular movements. ENT   Head: Normocephalic, right-sided facial swelling is noted with scattered contusions   Nose: No congestion/rhinnorhea.   Mouth/Throat: Mucous membranes are moist.   Neck: No stridor. Cardiovascular: Normal rate, regular rhythm. No murmurs, rubs, or gallops. Respiratory: Normal respiratory effort without tachypnea nor retractions. Breath sounds are clear and equal bilaterally. No  wheezes/rales/rhonchi. Gastrointestinal: Soft and nontender. Normal bowel sounds Musculoskeletal: Pain with range of motion of the left ankle Neurologic:  Normal speech and language. No gross focal neurologic deficits are appreciated.  Skin: Scattered contusions and abrasions are appreciated in various stages of development across most of her body including extremities and trunk Psychiatric: Mood and affect are normal. Speech and behavior are normal.  ____________________________________________  EKG: Interpreted by me.  Sinus tachycardia with rate of 124 bpm, left posterior fascicular block, normal axis, normal QT  ____________________________________________  ED COURSE:  As part of my medical decision making, I reviewed the following data within the electronic MEDICAL RECORD NUMBER History obtained from family if available, nursing notes, old chart and ekg, as well as notes from prior ED visits. Patient presented for a fall, we will assess with labs and imaging as indicated at this time.   Procedures ____________________________________________   LABS (pertinent positives/negatives)  Labs Reviewed  CBC - Abnormal; Notable for the following components:      Result Value   WBC 12.9 (*)    Hemoglobin 15.7 (*)    All other components within normal limits  BASIC METABOLIC PANEL  TROPONIN I  POCT PREGNANCY, URINE  POC URINE PREG, ED    RADIOLOGY Images were viewed by me  Chest x-ray, foot x-ray, hand x-ray, right shoulder x-ray, ankle x-rays are negative  ____________________________________________  DIFFERENTIAL DIAGNOSIS   Contusion, fracture, dislocation  FINAL ASSESSMENT AND PLAN  Fall, contusions, concussion   Plan: The patient had presented for chemical fall. Patient's labs are unremarkable. Patient's imaging did not reveal any acute fracture, she has numerous bruises throughout her body.  I asked her repeatedly if someone was trying to harm her and she told me  privately that there was no one physically assaulting her.  She is cleared for outpatient follow-up with muscle relaxants.   Ulice Dash, MD   Note: This note was generated in part or whole with voice recognition software. Voice recognition is usually quite accurate but there are transcription errors that can and very often do occur. I apologize for any typographical errors that were not detected and corrected.     Emily Filbert, MD 12/03/17 2023

## 2017-12-03 NOTE — ED Notes (Signed)
Patient transported to X-ray 

## 2017-12-06 ENCOUNTER — Encounter: Payer: Self-pay | Admitting: Certified Nurse Midwife

## 2017-12-06 ENCOUNTER — Ambulatory Visit: Payer: Commercial Managed Care - PPO | Admitting: Certified Nurse Midwife

## 2017-12-06 ENCOUNTER — Telehealth: Payer: Self-pay

## 2017-12-06 VITALS — BP 120/89 | HR 117 | Ht 61.0 in | Wt 122.3 lb

## 2017-12-06 DIAGNOSIS — Z3169 Encounter for other general counseling and advice on procreation: Secondary | ICD-10-CM

## 2017-12-06 DIAGNOSIS — Q796 Ehlers-Danlos syndrome, unspecified: Secondary | ICD-10-CM

## 2017-12-06 NOTE — Telephone Encounter (Signed)
Called pt to let her know a referral was placed by AT to an endocrinologist. Pt would like AT to call her back she has many questions.

## 2017-12-06 NOTE — Telephone Encounter (Signed)
Returned call to pt. She states she sees an endocrinologist in [place] and that she will continue to see them if/ when she gets pregnant. Explained that I would still like to do testing as discussed in our visit but given her history I would recommend getting established with reproductive endocrinology locally. She is declining. I informed her that if all test comes back normal and clomid/ovulation medications are not contraindicated that we could move forward but if she does not conceive with those medications she would need to see a specialist . She verbalizes understanding.   Doreene Burke, CNM

## 2017-12-06 NOTE — Patient Instructions (Signed)
Infertility  Infertility is when you are unable to get pregnant (conceive) after a year of having sex regularly without using birth control. Infertility can also mean that a woman is not able to carry a pregnancy to full term.  Both women and men can have fertility problems.  What causes infertility?  What Causes Infertility in Women?  There are many possible causes of infertility in women. For some women, the cause of infertility is not known (unexplained infertility). Infertility can also be linked to more than one cause. Infertility problems in women can be caused by problems with the menstrual cycle or reproductive organs, certain medical conditions, and factors related to lifestyle and age.   Problems with your menstrual cycle can interfere with your ovaries producing eggs (ovulation). This can make it difficult to get pregnant. This includes having a menstrual cycle that is very long, very short, or irregular.   Problems with reproductive organs can include:  ? An abnormally narrow cervix or a cervix that does not remain closed during a pregnancy.  ? A blockage in your fallopian tubes.  ? An abnormally shaped uterus.  ? Uterine fibroids. This is a tissue mass (tumor) that can develop on your uterus.   Medical conditions that can affect a woman's fertility include:  ? Polycystic ovarian syndrome (PCOS). This is a hormonal disorder that can cause small cysts to grow on your ovaries. This is the most common cause of infertility in women.  ? Endometriosis. This is a condition in which the tissue that lines your uterus (endometrium) grows outside of its normal location.  ? Primary ovary insufficiency. This is when your ovaries stop producing eggs and hormones before the age of 40.  ? Sexually transmitted diseases, such as chlamydia or gonorrhea. These infections can cause scarring in your fallopian tubes. This makes it difficult for eggs to reach your uterus.  ? Autoimmune disorders. These are disorders in which  your immune system attacks normal, healthy cells.  ? Hormone imbalances.   Other factors include:  ? Age. A woman's fertility declines with age, especially after her mid-30s.  ? Being under- or overweight.  ? Drinking too much alcohol.  ? Using drugs.  ? Exercising excessively.  ? Being exposed to environmental toxins, such as radiation, pesticides, and certain chemicals.    What Causes Infertility in Men?  There are many causes of infertility in men. Infertility can be linked to more than one cause. Infertility problems in men can be caused by problems with sperm or the reproductive organs, certain medical conditions, and factors related to lifestyle and age. Some men have unexplained infertility.   Problems with sperm. Infertility can result if there is a problem producing:  ? Enough sperm (low sperm count).  ? Enough normally-shaped sperm (sperm morphology).  ? Sperm that are able to reach the egg (poor motility).   Infertility can also be caused by:  ? A problem with hormones.  ? Enlarged veins (varicoceles), cysts (spermatoceles), or tumors of the testicles.  ? Sexual dysfunction.  ? Injury to the testicles.  ? A birth defect, such as not having the tubes that carry sperm (vas deferens).   Medical conditions that can affect a man's fertility include:  ? Diabetes.  ? Cancer treatments, such as chemotherapy or radiation.  ? Klinefelter syndrome. This is an inherited genetic disorder.  ? Thyroid problems, such as an under- or overactive thyroid.  ? Cystic fibrosis.  ? Sexually transmitted diseases.   Other factors   include:  ? Age. A man's fertility declines with age.  ? Drinking too much alcohol.  ? Using drugs.  ? Being exposed to environmental toxins, such as pesticides and lead.    What are the symptoms of infertility?  Being unable to get pregnant after one year of having regular sex without using birth control is the only sign of infertility.  How is infertility diagnosed?  In order to be diagnosed with  infertility, both partners will have a physical exam. Both partners will also have an extensive medical and sexual history taken. If there is no obvious reason for infertility, additional tests may be done.  What Tests Will Women Have?  Women may first have tests to check whether they are ovulating each month. The tests may include:   Blood tests to check hormone levels.   An ultrasound of the ovaries. This looks for possible problems on or in the ovaries.   Taking a small sample of the tissue that lines the uterus for examination under a microscope (endometrial biopsy).    Women who are ovulating may have additional tests. These may include:   Hysterosalpingography.  ? This is an X-ray of the fallopian tubes and uterus taken after a specific type of dye is injected.  ? This test can show the shape of the uterus and whether the fallopian tubes are open.   Laparoscopy.  ? In this test, a lighted tube (laparoscope) is used to look for problems in the fallopian tubes and other female organs.   Transvaginal ultrasound.  ? This is an imaging test to check for abnormalities of the uterus and ovaries.  ? A health care provider can use this test to count the number of follicles on the ovaries.   Hysteroscopy.  ? This test involves using a lighted tube to examine the cervix and inside the uterus.  ? It is done to find any abnormalities inside the uterus.    What Tests Will Men Have?  Tests for men's infertility includes:   Semen tests to check sperm count, morphology, and motility.   Blood tests to check for hormone levels.   Taking a small sample of tissue from inside a testicle (biopsy). This is examined under a microscope.   Blood tests to check for genetic abnormalities (genetic testing).    How are women treated for infertility?  Treatment depends on the cause of infertility. Most cases of infertility in women are treated with medicine or surgery.   Women may take medicine to:  ? Correct ovulation  problems.  ? Treat other health conditions, such as PCOS.   Surgery may be done to:  ? Repair damage to the ovaries, fallopian tubes, cervix, or uterus.  ? Remove growths from the uterus.  ? Remove scar tissue from the uterus, pelvis, or other female organs.    How are men treated for infertility?  Treatment depends on the cause of infertility. Most cases of infertility in men are treated with medicine or surgery.   Men may take medicine to:  ? Correct hormone problems.  ? Treat other health conditions.  ? Treat sexual dysfunction.   Surgery may be done to:  ? Remove blockages in the reproductive tract.  ? Correct other structural problems of the reproductive tract.    What is assisted reproductive technology?  Assisted reproductive technology (ART) refers to all treatments and procedures that combine eggs and sperm outside the body to try to help a couple conceive. ART is often   combined with fertility drugs to stimulate ovulation. Sometimes ART is done using eggs retrieved from another woman's body (donor eggs) or from previously frozen fertilized eggs (embryos).  There are different types of ART. These include:   Intrauterine insemination (IUI).  ? In this procedure, sperm is placed directly into a woman's uterus with a long, thin tube.  ? This may be most effective for infertility caused by sperm problems, including low sperm count and low motility.  ? Can be used in combination with fertility drugs.   In vitro fertilization (IVF).  ? This is often done when a woman's fallopian tubes are blocked or when a man has low sperm counts.  ? Fertility drugs stimulate the ovaries to produce multiple eggs. Once mature, these eggs are removed from the body and combined with the sperm to be fertilized.  ? These fertilized eggs are then placed in the woman's uterus.    This information is not intended to replace advice given to you by your health care provider. Make sure you discuss any questions you have with your  health care provider.  Document Released: 01/25/2003 Document Revised: 06/24/2015 Document Reviewed: 10/07/2013  Elsevier Interactive Patient Education  2018 [employer]

## 2017-12-06 NOTE — Progress Notes (Signed)
Subjective:    Regina Butler is a 32 y.o. female who presents for evaluation of infertility. Patient and partner have been attempting conception for 1 year. Marital status: married for 12 years. Pregnancies with current partner: yes.  Menstrual and Endocrine History LMP Patient's last menstrual period was 11/14/2017 (exact date).     Shortest interval 30  Longest interval 31  days  Duration of flow 5 days  Heavy menses the first 2 days  Clots no  Intermenstrual bleeding no  Postcoital bleeding no  Dysmenorrhea no  Amenorrhea no  Weight change no  Hirsutism no  Balding no  Acne no  Galactorrhea no   Obstetrical History Multiple miscarrage: Protien S  and factor 5 liden, Ehler's Danlos syndrom, Protein C deficiency On lovenox once pregnant  Gynecologic History Last PAP 2-3 months ago ( per pt)   Previous abdominal or pelvic surgery yes D&C  Pelvic pain no  Endometriosis no  Hot flashes no3  DES exposure no  Abnormal Pap no  Cervix Cryo/cone unknown  Sexually transmitted diseases no  Pelvic inflammatory disease no   Infertility and Endocrine Studies None Sexual History Frequency every day during fertile window per app on phone   Satisfied yes  Dyspareunia no   Contraception None use Para Gaurd IUD previously     Habits Cigarettes:    Wife -  yes, 1/2 packs per week    Husband - no Alcohol:    Wife -  occasionally     Husband - no Marijuana:   Wife - no   Husband - no  The following portions of the patient's history were reviewed and updated as appropriate: allergies, current medications, past family history, past medical history, past social history, past surgical history and problem list.  Review of Systems Pertinent items are noted in HPI.   Marital History: married # of years with this partner: 36   Paternity of Pregnancies: Number with this partner: 25, 12 miscarriages, 2 living children  Age of youngest child: 5 years     Objective:     Female Exam BP 120/89   Pulse (!) 117   Ht _0  (1.549 m)   Wt 122 lb 5 oz (55.5 kg)   LMP 11/14/2017 (Exact Date)   BMI 23.11 kg/m  Wt Readings from Last 1 Encounters:  12/06/17 122 lb 5 oz (55.5 kg)   BMI: Body mass index is 23.11 kg/m. General appearance: alert, cooperative and appears stated age Head: Normocephalic, without obvious abnormality, atraumatic, black eye and scratches on right side of face ( pt state she fell down stairs- dog triped her)  Eyes: conjunctivae/corneas clear. PERRL, EOM's intact. Fundi benign. Ears: normal TM's and external ear canals both ears Nose: Nares normal. Septum midline. Mucosa normal. No drainage or sinus tenderness., no discharge Abdomen: soft, non-tender; bowel sounds normal; no masses,  no organomegaly Pelvic: cervix normal in appearance, external genitalia normal, no adnexal masses or tenderness, no cervical motion tenderness, rectovaginal septum normal, uterus normal size, shape, and consistency and vagina normal without discharge Extremities: extremities normal, atraumatic, no cyanosis or edema Pulses: 2+ and symmetric   Assessment:    Secondary infertility   Plan:    TSH, prolactin, testosterone, FSH/LH, estradiol and pelvic ultrasound  Information given on semen analysis. Continue using ovulation kit and app on phone.  Discussed use of clomid . Will follow up with results. Return for blood work on day 2, 3 or 4 of cycle and ultrasound. Will follow up  with results and encourage pt to see reproductive endocrinologist given her history of Protein S deficiency, Protein C deficiency, Factor V leiden and Ehler's Danlos syndrome.   Philip Aspen, CNM

## 2017-12-07 LAB — PROLACTIN: Prolactin: 15.4 ng/mL (ref 4.8–23.3)

## 2017-12-07 LAB — TESTOSTERONE: Testosterone: 30 ng/dL (ref 8–48)

## 2017-12-07 LAB — TSH: TSH: 0.608 u[IU]/mL (ref 0.450–4.500)

## 2017-12-10 ENCOUNTER — Other Ambulatory Visit: Payer: Commercial Managed Care - PPO

## 2018-01-21 ENCOUNTER — Telehealth (HOSPITAL_COMMUNITY): Payer: Self-pay | Admitting: Psychiatry

## 2018-05-07 ENCOUNTER — Emergency Department: Payer: Commercial Managed Care - PPO | Admitting: Anesthesiology

## 2018-05-07 ENCOUNTER — Encounter: Payer: Self-pay | Admitting: Emergency Medicine

## 2018-05-07 ENCOUNTER — Encounter
Admission: EM | Disposition: A | Discharge status: Home / Self Care | Payer: Self-pay | Source: Home / Self Care | Attending: Emergency Medicine

## 2018-05-07 ENCOUNTER — Other Ambulatory Visit: Payer: Self-pay

## 2018-05-07 ENCOUNTER — Emergency Department
Admission: EM | Admit: 2018-05-07 | Discharge: 2018-05-07 | Disposition: A | Discharge status: Home / Self Care | Payer: Commercial Managed Care - PPO | Attending: Obstetrics and Gynecology | Admitting: Obstetrics and Gynecology

## 2018-05-07 DIAGNOSIS — Z8673 Personal history of transient ischemic attack (TIA), and cerebral infarction without residual deficits: Secondary | ICD-10-CM | POA: Diagnosis not present

## 2018-05-07 DIAGNOSIS — Z91018 Allergy to other foods: Secondary | ICD-10-CM | POA: Diagnosis not present

## 2018-05-07 DIAGNOSIS — F419 Anxiety disorder, unspecified: Secondary | ICD-10-CM | POA: Insufficient documentation

## 2018-05-07 DIAGNOSIS — Z79899 Other long term (current) drug therapy: Secondary | ICD-10-CM | POA: Insufficient documentation

## 2018-05-07 DIAGNOSIS — Z880 Allergy status to penicillin: Secondary | ICD-10-CM | POA: Insufficient documentation

## 2018-05-07 DIAGNOSIS — D682 Hereditary deficiency of other clotting factors: Secondary | ICD-10-CM | POA: Diagnosis not present

## 2018-05-07 DIAGNOSIS — N939 Abnormal uterine and vaginal bleeding, unspecified: Secondary | ICD-10-CM

## 2018-05-07 DIAGNOSIS — Z886 Allergy status to analgesic agent status: Secondary | ICD-10-CM | POA: Insufficient documentation

## 2018-05-07 DIAGNOSIS — D6859 Other primary thrombophilia: Secondary | ICD-10-CM | POA: Insufficient documentation

## 2018-05-07 DIAGNOSIS — Z791 Long term (current) use of non-steroidal anti-inflammatories (NSAID): Secondary | ICD-10-CM | POA: Diagnosis not present

## 2018-05-07 DIAGNOSIS — F17210 Nicotine dependence, cigarettes, uncomplicated: Secondary | ICD-10-CM | POA: Insufficient documentation

## 2018-05-07 DIAGNOSIS — Z888 Allergy status to other drugs, medicaments and biological substances status: Secondary | ICD-10-CM | POA: Diagnosis not present

## 2018-05-07 DIAGNOSIS — N930 Postcoital and contact bleeding: Secondary | ICD-10-CM | POA: Diagnosis not present

## 2018-05-07 DIAGNOSIS — N96 Recurrent pregnancy loss: Secondary | ICD-10-CM | POA: Diagnosis not present

## 2018-05-07 DIAGNOSIS — S3141XA Laceration without foreign body of vagina and vulva, initial encounter: Secondary | ICD-10-CM

## 2018-05-07 HISTORY — PX: PERINEAL LACERATION REPAIR: SHX5389

## 2018-05-07 LAB — URINE DRUG SCREEN, QUALITATIVE (ARMC ONLY)
Amphetamines, Ur Screen: NOT DETECTED
Barbiturates, Ur Screen: NOT DETECTED
Benzodiazepine, Ur Scrn: POSITIVE — AB
Cannabinoid 50 Ng, Ur ~~LOC~~: NOT DETECTED
Cocaine Metabolite,Ur ~~LOC~~: NOT DETECTED
MDMA (Ecstasy)Ur Screen: NOT DETECTED
Methadone Scn, Ur: NOT DETECTED
Opiate, Ur Screen: NOT DETECTED
Phencyclidine (PCP) Ur S: NOT DETECTED
Tricyclic, Ur Screen: NOT DETECTED

## 2018-05-07 LAB — COMPREHENSIVE METABOLIC PANEL
ALT: 14 U/L (ref 0–44)
AST: 21 U/L (ref 15–41)
Albumin: 3.6 g/dL (ref 3.5–5.0)
Alkaline Phosphatase: 43 U/L (ref 38–126)
Anion gap: 9 (ref 5–15)
BUN: 17 mg/dL (ref 6–20)
CO2: 21 mmol/L — ABNORMAL LOW (ref 22–32)
Calcium: 8.4 mg/dL — ABNORMAL LOW (ref 8.9–10.3)
Chloride: 110 mmol/L (ref 98–111)
Creatinine, Ser: 1.16 mg/dL — ABNORMAL HIGH (ref 0.44–1.00)
GFR calc Af Amer: >60 mL/min (ref >60)
GFR calc non Af Amer: >60 mL/min (ref >60)
Glucose, Bld: 127 mg/dL — ABNORMAL HIGH (ref 70–99)
Potassium: 3.5 mmol/L (ref 3.5–5.1)
Sodium: 140 mmol/L (ref 135–145)
Total Bilirubin: 0.5 mg/dL (ref 0.3–1.2)
Total Protein: 6 g/dL — ABNORMAL LOW (ref 6.5–8.1)

## 2018-05-07 LAB — CBC
HCT: 34 % — ABNORMAL LOW (ref 36.0–46.0)
Hemoglobin: 11.7 g/dL — ABNORMAL LOW (ref 12.0–15.0)
MCH: 33.5 pg (ref 26.0–34.0)
MCHC: 34.4 g/dL (ref 30.0–36.0)
MCV: 97.4 fL (ref 80.0–100.0)
Platelets: 242 10*3/uL (ref 150–400)
RBC: 3.49 MIL/uL — ABNORMAL LOW (ref 3.87–5.11)
RDW: 12 % (ref 11.5–15.5)
WBC: 20.5 10*3/uL — ABNORMAL HIGH (ref 4.0–10.5)
nRBC: 0 % (ref 0.0–0.2)

## 2018-05-07 LAB — PROTIME-INR
INR: 1 (ref 0.8–1.2)
Prothrombin Time: 13.4 seconds (ref 11.4–15.2)

## 2018-05-07 SURGERY — EXAM UNDER ANESTHESIA
Anesthesia: General | Site: Vagina

## 2018-05-07 MED ORDER — ONDANSETRON HCL 4 MG/2ML IJ SOLN
INTRAMUSCULAR | Status: AC
  Administered 2018-05-07: 4 mg via INTRAVENOUS
  Filled 2018-05-07: qty 2

## 2018-05-07 MED ORDER — FENTANYL CITRATE (PF) 100 MCG/2ML IJ SOLN
INTRAMUSCULAR | Status: AC
  Administered 2018-05-07: 25 ug via INTRAVENOUS
  Filled 2018-05-07: qty 2

## 2018-05-07 MED ORDER — ONDANSETRON HCL 4 MG/2ML IJ SOLN
INTRAMUSCULAR | Status: DC | PRN
  Administered 2018-05-07: 4 mg via INTRAVENOUS

## 2018-05-07 MED ORDER — LACTATED RINGERS IV SOLN
INTRAVENOUS | Status: DC | PRN
  Administered 2018-05-07: 07:00:00 via INTRAVENOUS

## 2018-05-07 MED ORDER — ONDANSETRON HCL 4 MG/2ML IJ SOLN: 4.0000 mg | Freq: Once | INTRAMUSCULAR | Status: DC | PRN

## 2018-05-07 MED ORDER — SODIUM CHLORIDE 0.9 % IV BOLUS
1000.0000 mL | Freq: Once | INTRAVENOUS | Status: AC
  Administered 2018-05-07: 1000 mL via INTRAVENOUS

## 2018-05-07 MED ORDER — FENTANYL CITRATE (PF) 100 MCG/2ML IJ SOLN
INTRAMUSCULAR | Status: DC | PRN
  Administered 2018-05-07 (×2): 50 ug via INTRAVENOUS

## 2018-05-07 MED ORDER — OXYCODONE HCL 5 MG PO TABS
ORAL_TABLET | ORAL | Status: AC
  Administered 2018-05-07: 5 mg via ORAL
  Filled 2018-05-07: qty 1

## 2018-05-07 MED ORDER — LIDOCAINE HCL (CARDIAC) PF 100 MG/5ML IV SOSY
PREFILLED_SYRINGE | INTRAVENOUS | Status: DC | PRN
  Administered 2018-05-07: 100 mg via INTRAVENOUS

## 2018-05-07 MED ORDER — PROPOFOL 10 MG/ML IV BOLUS
INTRAVENOUS | Status: DC | PRN
  Administered 2018-05-07: 170 mg via INTRAVENOUS

## 2018-05-07 MED ORDER — OXYCODONE HCL 5 MG PO TABS
5.0000 mg | ORAL_TABLET | Freq: Once | ORAL | Status: AC
  Administered 2018-05-07: 08:00:00 5 mg via ORAL
  Filled 2018-05-07: qty 1

## 2018-05-07 MED ORDER — MIDAZOLAM HCL 2 MG/2ML IJ SOLN
INTRAMUSCULAR | Status: AC
  Filled 2018-05-07: qty 2

## 2018-05-07 MED ORDER — DEXAMETHASONE SODIUM PHOSPHATE 10 MG/ML IJ SOLN
INTRAMUSCULAR | Status: DC | PRN
  Administered 2018-05-07: 10 mg via INTRAVENOUS

## 2018-05-07 MED ORDER — PROPOFOL 10 MG/ML IV BOLUS
INTRAVENOUS | Status: AC
  Filled 2018-05-07: qty 20

## 2018-05-07 MED ORDER — MIDAZOLAM HCL 2 MG/2ML IJ SOLN: INTRAMUSCULAR | Status: DC | PRN

## 2018-05-07 MED ORDER — FENTANYL CITRATE (PF) 100 MCG/2ML IJ SOLN
25.0000 ug | INTRAMUSCULAR | Status: DC | PRN
  Administered 2018-05-07 (×4): 25 ug via INTRAVENOUS

## 2018-05-07 MED ORDER — MIDAZOLAM HCL 2 MG/2ML IJ SOLN
INTRAMUSCULAR | Status: DC | PRN
  Administered 2018-05-07: 2 mg via INTRAVENOUS

## 2018-05-07 MED ORDER — FENTANYL CITRATE (PF) 100 MCG/2ML IJ SOLN
INTRAMUSCULAR | Status: AC
  Filled 2018-05-07: qty 2

## 2018-05-07 MED ORDER — LIDOCAINE HCL (PF) 2 % IJ SOLN
INTRAMUSCULAR | Status: AC
  Filled 2018-05-07: qty 10

## 2018-05-07 MED ORDER — ONDANSETRON HCL 4 MG/2ML IJ SOLN
4.0000 mg | Freq: Once | INTRAMUSCULAR | Status: AC
  Administered 2018-05-07: 4 mg via INTRAVENOUS

## 2018-05-07 MED ORDER — SUCCINYLCHOLINE CHLORIDE 20 MG/ML IJ SOLN
INTRAMUSCULAR | Status: DC | PRN
  Administered 2018-05-07: 100 mg via INTRAVENOUS

## 2018-05-07 SURGICAL SUPPLY — 21 items
APL SWBSTK 6 STRL LF DISP (MISCELLANEOUS) ×2
APPLICATOR COTTON TIP 6 STRL (MISCELLANEOUS) IMPLANT
APPLICATOR COTTON TIP 6IN STRL (MISCELLANEOUS) ×4
CATH INTERMIT 14FR 6IN FEMALE (CATHETERS) ×1 IMPLANT
CATH PEDI SILICON 3C 10FR (CATHETERS) ×1 IMPLANT
GLOVE BIO SURGEON STRL SZ7 (GLOVE) ×1 IMPLANT
GLOVE BIOGEL PI IND STRL 6.5 (GLOVE) ×1 IMPLANT
GLOVE BIOGEL PI INDICATOR 6.5 (GLOVE) ×1
GLOVE INDICATOR 7.0 STRL GRN (GLOVE) ×1 IMPLANT
GLOVE SURG SYN 6.5 ES PF (GLOVE) ×6 IMPLANT
GLOVE SURG SYN 6.5 PF PI (GLOVE) ×2 IMPLANT
GOWN STRL REUS W/ TWL LRG LVL3 (GOWN DISPOSABLE) ×2 IMPLANT
GOWN STRL REUS W/TWL LRG LVL3 (GOWN DISPOSABLE) ×4
NS IRRIG 500ML POUR BTL (IV SOLUTION) ×2 IMPLANT
PACK DNC HYST (MISCELLANEOUS) ×2 IMPLANT
PAD OB MATERNITY 4.3X12.25 (PERSONAL CARE ITEMS) ×2 IMPLANT
PAD PREP 24X41 OB/GYN DISP (PERSONAL CARE ITEMS) ×2 IMPLANT
STRAP SAFETY 5IN WIDE (MISCELLANEOUS) ×2 IMPLANT
SUT VIC AB 2-0 SH 27 (SUTURE) ×2
SUT VIC AB 2-0 SH 27XBRD (SUTURE) IMPLANT
TOWEL OR 17X26 4PK STRL BLUE (TOWEL DISPOSABLE) ×2 IMPLANT

## 2018-05-07 NOTE — OR Nursing (Signed)
In and Out cath in OR by K. Dameron. UDS obtained and Upreg negative per Dr. Jerene Pitch

## 2018-05-07 NOTE — ED Notes (Signed)
Report given to OR RN.

## 2018-05-07 NOTE — Anesthesia Preprocedure Evaluation (Signed)
Anesthesia Evaluation  Patient identified by MRN, date of birth, ID band Patient awake    Reviewed: Allergy & Precautions, NPO status , Patient's Chart, lab work & pertinent test results, reviewed documented beta blocker date and time   Airway Mallampati: II  TM Distance: >3 FB     Dental  (+) Chipped   Pulmonary Current Smoker,           Cardiovascular      Neuro/Psych  Headaches, PSYCHIATRIC DISORDERS Anxiety Depression TIA   GI/Hepatic   Endo/Other    Renal/GU      Musculoskeletal   Abdominal   Peds  Hematology   Anesthesia Other Findings Pt states she has excessive shaking and dec breathing when she wakes up from anesthesia. Will decrease the amount of narcotics given intraop.  Reproductive/Obstetrics                             Anesthesia Physical Anesthesia Plan  ASA: II  Anesthesia Plan: General   Post-op Pain Management:    Induction: Intravenous  PONV Risk Score and Plan:   Airway Management Planned: LMA  Additional Equipment:   Intra-op Plan:   Post-operative Plan:   Informed Consent: I have reviewed the patients History and Physical, chart, labs and discussed the procedure including the risks, benefits and alternatives for the proposed anesthesia with the patient or authorized representative who has indicated his/her understanding and acceptance.       Plan Discussed with: CRNA  Anesthesia Plan Comments:         Anesthesia Quick Evaluation

## 2018-05-07 NOTE — Anesthesia Postprocedure Evaluation (Signed)
Anesthesia Post Note  Patient: Regina Butler  Procedure(s) Performed: EXAM UNDER ANESTHESIA (N/A Vagina ) SUTURE REPAIR PERINEAL LACERATION (N/A Vagina )  Patient location during evaluation: PACU Anesthesia Type: General Level of consciousness: awake and alert Pain management: pain level controlled Vital Signs Assessment: post-procedure vital signs reviewed and stable Respiratory status: spontaneous breathing, nonlabored ventilation, respiratory function stable and patient connected to nasal cannula oxygen Cardiovascular status: blood pressure returned to baseline and stable Postop Assessment: no apparent nausea or vomiting Anesthetic complications: no     Last Vitals:  Vitals:   05/07/18 0742 05/07/18 0757  BP: 100/71   Pulse: (!) 107   Resp: 18   Temp:  36.8 C  SpO2: (!) 18% 99%    Last Pain:  Vitals:   05/07/18 0757  PainSc: 3                  Cleda Mccreedy Piscitello

## 2018-05-07 NOTE — ED Notes (Signed)
Patient with large amount of bleeding (blood dried down entire legs) present from vagina. Patient reports having sexual intercourse with husband. States husband stated something was not right, patient began having large amount of "pouring blood". Patient also reports having h/o clotting disorder.

## 2018-05-07 NOTE — Anesthesia Post-op Follow-up Note (Signed)
Anesthesia QCDR form completed.        

## 2018-05-07 NOTE — Op Note (Signed)
Operative Report 05/07/18 7:11 AM  Preoperative diagnosis: Vaginal bleeding after intercourse  Postoperative diagnosis: Same  Procedure:Exam under anesthesia, repair of vaginal sulcal laceration   Surgeon: Adelene Idler MD  Assistant: None  Anesthesia: general  Findings: Right vaginal sulcal laceration.  Estimated blood loss: less than 10cc cc  Specimen: None   Disposition: PACU in stable condition  Findings: Cervix grossly normal. Uterus and ovaries normal on bimanual examination. Right sulcal vaginal laceration.   Procedure: Patient was taken to the OR where she was placed in dorsal lithotomy in Allen stirrups. She was prepped and draped in the usual sterile fashion. A timeout was performed.  Right angle retractors were used to visualize the vagina and cervix. The cervix was grasped with a ring forcep.  The cervix was visualized and was grossly normal. IUD strings were not seen from the cervical os.  A right sulcal laceration was seen. The laceration was repaired with 2-0 vicryl suture in a running stitch. A rectal examination was performed and was normal. There was no lacerations. No sutures felt in the rectum. Bimanual exam of the uterus and adnexa was normal. Counts correct x 2. Patient taken to the PACU in stable condition.    In PACU bedside abdominal US performed. Could not see IUD on bedside ultrasound. Patient agreeable to following up outpatient for locating the IUD.  Natale Milch, MD 05/07/2018, 7:11 AM

## 2018-05-07 NOTE — ED Notes (Signed)
Patient continues to be hypotensive, but asymptomatic at this time (previously had syncopal episode prior to arrival.

## 2018-05-07 NOTE — H&P (Signed)
H&P  Regina Butler is an 33 y.o. female.  HPI:  She presents today for heavy vaginal bleeding after intercourse. She reports that she had sex at the [facility] with her partner around 11pm. She then began having heavy vaginal bleeding during intercourse. She had a syncopal episode and her partner brought her to the ER. She is feeling better. She denies the use of objects or toys during intercourse. She can not tolerate examination in the ER.   GYN History Reports that she has a history of abnormal pap smears. Says that usually she has a colposcopy and is told it is okay.  In our EMR last colposcopy was 2016. In 2018 pap smear was NIL Reports that she has had chlamydia in the remote past. She uses a Paragard for contraception  OB History  Gravida Para Term Preterm AB Living  14 2 2  0 12 2  SAB TAB Ectopic Multiple Live Births  12 0 0 0 1    # Outcome Date GA Lbr Len/2nd Weight Sex Delivery Anes PTL Lv  14 Term 12/02/11 [redacted]w[redacted]d 22:50 / 00:08 3084 g F Vag-Spont EPI  LIV  13 Term 07/04/03 [redacted]w[redacted]d  2523 g F Vag-Spont EPI    12 SAB           11 SAB           10 SAB           9 SAB           8 SAB           7 SAB           6 SAB           5 SAB           4 SAB           3 SAB           2 SAB           1 SAB           History of recurrent miscarriages. Two uncomplicated vaginal deliveries.     Past Medical History:  Diagnosis Date  . Anxiety   . Chlamydia infection 10/01/2014   Treated on 10/01/14. Negative TOC on 11/11/14.   . Class III atherosclerotic vascular disease   . Complication of anesthesia   . CVA (cerebral vascular accident) (HCC) 07/2007   facial droop/paralysis  . Ehler's-Danlos syndrome   . Factor V deficiency (HCC)    heterozygote  . Low grade squamous intraepithelial lesion (LGSIL) pap smear 10/01/2014   Colposcopy done 11/10/14  . Personal history of TIA (transient ischemic attack) 07/2007   facial droop/paralysis  . Protein C deficiency complicating pregnancy  (HCC)   . Protein S deficiency complicating pregnancy (HCC)   . Rh negative status during pregnancy   . SAB (spontaneous abortion)    11 previous sab  . Thrombophilia (HCC)     Past Surgical History:  Procedure Laterality Date  . ANKLE SURGERY Left   . BREAST ENHANCEMENT SURGERY    . DILATION AND CURETTAGE OF UTERUS    . SHOULDER SURGERY    . TONSILLECTOMY      Family History  Problem Relation Age of Onset  . Birth defects Brother        tethered spinal cord/club foot    Social History:  reports that she has been smoking cigarettes. She has been smoking about 0.50 packs per day. She  has never used smokeless tobacco. She reports current alcohol use of about 1.0 standard drinks of alcohol per week. She reports that she does not use drugs.  Allergies:  Allergies  Allergen Reactions  . Coconut Fatty Acids Anaphylaxis  . Amoxicillin Hives  . Estrogens     Due to blood clotting disorder. Estrogen would make clotting worse per pt's hemetologist  . Naprosyn [Naproxen] Other (See Comments)    Pt reports allergy due to blood clotting disorder  . Penicillins Hives    Medications: I have reviewed the patient's current medications. She reports that she occasionally takes xanax She takes metoprolol as well, but that is a newer medication and sometimes she forgets.   Results for orders placed or performed during the hospital encounter of 05/07/18 (from the past 48 hour(s))  CBC     Status: Abnormal   Collection Time: 05/07/18  5:01 AM  Result Value Ref Range   WBC 20.5 (H) 4.0 - 10.5 K/uL   RBC 3.49 (L) 3.87 - 5.11 MIL/uL   Hemoglobin 11.7 (L) 12.0 - 15.0 g/dL   HCT 81.1 (L) 03.1 - 59.4 %   MCV 97.4 80.0 - 100.0 fL   MCH 33.5 26.0 - 34.0 pg   MCHC 34.4 30.0 - 36.0 g/dL   RDW 58.5 92.9 - 24.4 %   Platelets 242 150 - 400 K/uL   nRBC 0.0 0.0 - 0.2 %    Comment: Performed at [facility], [address] [place], [place] [address]  Comprehensive metabolic panel      Status: Abnormal   Collection Time: 05/07/18  5:01 AM  Result Value Ref Range   Sodium 140 135 - 145 mmol/L   Potassium 3.5 3.5 - 5.1 mmol/L   Chloride 110 98 - 111 mmol/L   CO2 21 (L) 22 - 32 mmol/L   Glucose, Bld 127 (H) 70 - 99 mg/dL   BUN 17 6 - 20 mg/dL   Creatinine, Ser 8.17 (H) 0.44 - 1.00 mg/dL   Calcium 8.4 (L) 8.9 - 10.3 mg/dL   Total Protein 6.0 (L) 6.5 - 8.1 g/dL   Albumin 3.6 3.5 - 5.0 g/dL   AST 21 15 - 41 U/L   ALT 14 0 - 44 U/L   Alkaline Phosphatase 43 38 - 126 U/L   Total Bilirubin 0.5 0.3 - 1.2 mg/dL   GFR calc non Af Amer >60 >60 mL/min   GFR calc Af Amer >60 >60 mL/min   Anion gap 9 5 - 15    Comment: Performed at [facility], [address] [place], [place] [address]  Type and screen [facility]     Status: None   Collection Time: 05/07/18  5:01 AM  Result Value Ref Range   ABO/RH(D) A NEG    Antibody Screen NEG    Sample Expiration      05/10/2018 Performed at [facility], [address] [place], [place] [address]   Protime-INR     Status: None   Collection Time: 05/07/18  5:01 AM  Result Value Ref Range   Prothrombin Time 13.4 11.4 - 15.2 seconds   INR 1.0 0.8 - 1.2    Comment: (NOTE) INR goal varies based on device and disease states. Performed at [facility], [address] [place], [place] [address]     No results found.  Review of Systems  Constitutional: Negative for chills, fever, malaise/fatigue and weight loss.  HENT: Negative for congestion, hearing  loss and sinus pain.   Eyes: Negative for blurred vision and double vision.  Respiratory: Negative for cough, sputum production, shortness of breath and wheezing.   Cardiovascular: Negative for chest pain, palpitations, orthopnea and leg swelling.  Gastrointestinal: Negative for abdominal pain, constipation, diarrhea, nausea and vomiting.  Genitourinary: Negative for dysuria, flank pain, frequency, hematuria and urgency.   Musculoskeletal: Negative for back pain, falls and joint pain.  Skin: Negative for itching and rash.  Neurological: Negative for dizziness and headaches.  Psychiatric/Behavioral: Negative for depression, substance abuse and suicidal ideas. The patient is not nervous/anxious.    Blood pressure 110/70, pulse (!) 105, resp. rate 17, SpO2 100 %. Physical Exam  Nursing note and vitals reviewed. Constitutional: She is oriented to person, place, and time. She appears well-developed and well-nourished.  HENT:  Head: Normocephalic and atraumatic.  Cardiovascular: Normal rate and regular rhythm.  Respiratory: Effort normal and breath sounds normal.  GI: Soft. Bowel sounds are normal.  Genitourinary:    Genitourinary Comments: Perineum and legs stained with blood. Gentle bimanual examination. Patient very tender. Dark red blood on examination. Cervix feels enlarged.  Exam limited because of patient discomfort.   Musculoskeletal: Normal range of motion.  Neurological: She is alert and oriented to person, place, and time.  Skin: Skin is warm and dry.  Psychiatric: She has a normal mood and affect. Her behavior is normal. Judgment and thought content normal.    Assessment/Plan: 33 yo with a vaginal laceration Will take her to the OR for exam under anesthesia.  Discussed risks benefits and alternatives. Patient agrees to surgery.   Aizah Gehlhausen R Aveion Nguyen 05/07/2018, 6:08 AM

## 2018-05-07 NOTE — Transfer of Care (Signed)
Immediate Anesthesia Transfer of Care Note  Patient: Regina Butler  Procedure(s) Performed: EXAM UNDER ANESTHESIA (N/A Vagina ) SUTURE REPAIR PERINEAL LACERATION (N/A Vagina )  Patient Location: PACU  Anesthesia Type:General  Level of Consciousness: awake and sedated  Airway & Oxygen Therapy: Patient Spontanous Breathing and Patient connected to face mask oxygen  Post-op Assessment: Report given to RN and Post -op Vital signs reviewed and stable  Post vital signs: Reviewed and stable  Last Vitals:  Vitals Value Taken Time  BP    Temp    Pulse 115 05/07/2018  7:11 AM  Resp 35 05/07/2018  7:11 AM  SpO2 100 % 05/07/2018  7:11 AM  Vitals shown include unvalidated device data.  Last Pain:  Vitals:   05/07/18 0440  PainSc: 8          Complications: No apparent anesthesia complications

## 2018-05-07 NOTE — Anesthesia Procedure Notes (Signed)
Procedure Name: Intubation Date/Time: 05/07/2018 6:37 AM Performed by: Waldo Laine, CRNA Pre-anesthesia Checklist: Patient identified, Patient being monitored, Timeout performed, Emergency Drugs available and Suction available Patient Re-evaluated:Patient Re-evaluated prior to induction Oxygen Delivery Method: Circle system utilized Preoxygenation: Pre-oxygenation with 100% oxygen Induction Type: IV induction Ventilation: Mask ventilation without difficulty Laryngoscope Size: Miller and 2 Grade View: Grade I Tube type: Oral Tube size: 7.0 mm Number of attempts: 1 Airway Equipment and Method: Stylet Placement Confirmation: ETT inserted through vocal cords under direct vision,  positive ETCO2 and breath sounds checked- equal and bilateral Secured at: 21 cm Tube secured with: Tape Dental Injury: Teeth and Oropharynx as per pre-operative assessment

## 2018-05-07 NOTE — ED Triage Notes (Signed)
Pt carried into lobby by SO, grimacing;pt reports sudden onset vag bleeding and abd pain after intercourse

## 2018-05-07 NOTE — ED Notes (Signed)
Patient continues to be hypertensive, MD aware, fluids prepped for administration at this time.

## 2018-05-07 NOTE — ED Provider Notes (Signed)
[facility] Emergency Department Provider Note   First MD Initiated Contact with Patient 05/07/18 [phone]     (approximate)  I have reviewed the triage vital signs and the nursing notes.   HISTORY  Chief Complaint Vaginal Bleeding   HPI Regina Butler is a 33 y.o. female with history of protein S deficiency with medical history as listed below presents to the emergency department with "heavy vaginal bleeding with onset during intercourse.  Patient states that bleeding began 4 hours ago during sexual intercourse and that she has been profusely bleeding since.  Patient states that she has used 9 tampon since onset        Past Medical History:  Diagnosis Date  . Anxiety   . Chlamydia infection 10/01/2014   Treated on 10/01/14. Negative TOC on 11/11/14.   . Class III atherosclerotic vascular disease   . Complication of anesthesia   . CVA (cerebral vascular accident) (HCC) 07/2007   facial droop/paralysis  . Ehler's-Danlos syndrome   . Factor V deficiency (HCC)    heterozygote  . Low grade squamous intraepithelial lesion (LGSIL) pap smear 10/01/2014   Colposcopy done 11/10/14  . Personal history of TIA (transient ischemic attack) 07/2007   facial droop/paralysis  . Protein C deficiency complicating pregnancy (HCC)   . Protein S deficiency complicating pregnancy (HCC)   . Rh negative status during pregnancy   . SAB (spontaneous abortion)    11 previous sab  . Thrombophilia [facility])     Patient Active Problem List   Diagnosis Date Noted  . Low grade squamous intraepithelial lesion (LGSIL) pap smear 10/01/2014  . Depression 02/17/2014  . FACTOR V LEIDEN 08/04/2008  . Primary hypercoagulable state (HCC) 08/04/2008  . CEREBROVASCULAR ACCIDENT 08/04/2008  . Phlebitis and thrombophlebitis of other site 08/04/2008  . Headache(784.0) 08/04/2008    Past Surgical History:  Procedure Laterality Date  . ANKLE SURGERY Left   . DILATION AND CURETTAGE OF UTERUS     . SHOULDER SURGERY      Prior to Admission medications   Medication Sig Start Date End Date Taking? Authorizing Provider  ALPRAZolam (XANAX) 1 MG tablet TAKE 1 TABLET BY MOUTH 3 TIMES A DAY AS NEEDED FOR SLEEP OR ANXIETY. DO NOT COMBINE WITH VALIUM. 11/23/17   [provider]  diazepam (VALIUM) 10 MG tablet TAKE 1 TABLET BY MOUTH  EVERY 8 HOURS AS NEEDED FOR ANXIETY . TAKE SPARINGLY  FOR BREAKTHROUGH ANXIETY.  DO NOT COMBINE WITH XANAX. 11/21/17   [provider]  EPINEPHrine 0.3 mg/0.3 mL IJ SOAJ injection Inject into the muscle. 08/13/14   [provider]  meloxicam (MOBIC) 15 MG tablet TAKE 1 TABLET BY MOUTH  DAILY 10/11/17   [provider]  metroNIDAZOLE (METROGEL) 0.75 % vaginal gel Place 1 Applicatorful vaginally at bedtime. Apply one applicatorful to vagina at bedtime for 10 days, then 2x/weekly as needed 02/27/16   Reva Bores, MD  oxyCODONE-acetaminophen (PERCOCET) 5-325 MG tablet Take 1 tablet by mouth every 8 (eight) hours as needed. 12/03/17   Emily Filbert, MD    Allergies Coconut fatty acids; Amoxicillin; Estrogens; Naprosyn [naproxen]; and Penicillins  Family History  Problem Relation Age of Onset  . Birth defects Brother        tethered spinal cord/club foot    Social History Social History   Tobacco Use  . Smoking status: Current Every Day Smoker    Packs/day: 0.50    Types: Cigarettes  . Smokeless tobacco:  Never Used  Substance Use Topics  . Alcohol use: Yes    Alcohol/week: 1.0 standard drinks    Types: 1 Glasses of wine per week  . Drug use: No    Review of Systems Constitutional: No fever/chills Eyes: No visual changes. ENT: No sore throat. Cardiovascular: Denies chest pain. Respiratory: Denies shortness of breath. Gastrointestinal: No abdominal pain.  No nausea, no vomiting.  No diarrhea.  No constipation. Genitourinary: Negative for dysuria.  Positive for vaginal bleeding Musculoskeletal: Negative for  neck pain.  Negative for back pain. Integumentary: Negative for rash. Neurological: Negative for headaches, focal weakness or numbness.   ____________________________________________   PHYSICAL EXAM:  VITAL SIGNS: ED Triage Vitals  Enc Vitals Group     BP 05/07/18 0454 (!) 72/56     Pulse Rate 05/07/18 0516 95     Resp 05/07/18 0516 16     Temp --      Temp src --      SpO2 05/07/18 0516 100 %     Weight --      Height --      Head Circumference --      Peak Flow --      Pain Score 05/07/18 0440 8     Pain Loc --      Pain Edu? --      Excl. in GC? --     Constitutional: Alert and oriented.  Apparent discomfort  eyes: Conjunctivae are normal.  Mouth/Throat: Mucous membranes are moist.  Oropharynx non-erythematous. Neck: No stridor. Cardiovascular: Normal rate, regular rhythm. Good peripheral circulation. Grossly normal heart sounds. Respiratory: Normal respiratory effort.  No retractions. Lungs CTAB. Gastrointestinal: Soft and nontender. No distention.  Genitourinary: Moderate vaginal bleeding however exam limited secondary to discomfort Musculoskeletal: No lower extremity tenderness nor edema. No gross deformities of extremities. Neurologic:  Normal speech and language. No gross focal neurologic deficits are appreciated.  Skin:  Skin is warm, dry and intact. No rash noted.   ____________________________________________   LABS (all labs ordered are listed, but only abnormal results are displayed)  Labs Reviewed  CBC - Abnormal; Notable for the following components:      Result Value   WBC 20.5 (*)    RBC 3.49 (*)    Hemoglobin 11.7 (*)    HCT 34.0 (*)    All other components within normal limits  COMPREHENSIVE METABOLIC PANEL - Abnormal; Notable for the following components:   CO2 21 (*)    Glucose, Bld 127 (*)    Creatinine, Ser 1.16 (*)    Calcium 8.4 (*)    Total Protein 6.0 (*)    All other components within normal limits  PROTIME-INR  TYPE AND  SCREEN      Procedures   ____________________________________________   INITIAL IMPRESSION / MDM / ASSESSMENT AND PLAN / ED COURSE  As part of my medical decision making, I reviewed the following data within the electronic MEDICAL RECORD NUMBER   33 year old female presenting with above-stated history and physical exam secondary to vaginal bleeding with acute onset during intercourse.  Unable to perform vaginal exam secondary to pain and as such patient discussed with Dr. Gaynelle Arabian OB/GYN on-call for exam under anesthesia.  Dr. Gaynelle Arabian presented to the ED promptly and evaluated patient and agreed with plan for exam under  anesthesia      ____________________________________________  FINAL CLINICAL IMPRESSION(S) / ED DIAGNOSES  Final diagnoses:  Vaginal bleeding     MEDICATIONS GIVEN DURING THIS VISIT:  Medications  sodium chloride 0.9 % bolus 1,000 mL (1,000 mLs Intravenous Bolus from Bag 05/07/18 0504)  sodium chloride 0.9 % bolus 1,000 mL (1,000 mLs Intravenous Bolus from Bag 05/07/18 0503)  ondansetron (ZOFRAN) injection 4 mg (4 mg Intravenous Given 05/07/18 0502)     ED Discharge Orders    None       Note:  This document was prepared using Dragon voice recognition software and may include unintentional dictation errors.   Darci Current, MD 05/07/18 [phone]

## 2018-05-07 NOTE — Discharge Instructions (Signed)
Vaginal Laceration    A vaginal laceration is a cut or tear of the opening of your vagina, the inside of the vaginal canal, or the skin between your vaginal opening and your anus (perineum).  What are the causes?  This condition may be caused by:   Childbirth, or tools used to help deliver a baby, such as forceps.   Sex.   An injury from sports, bike riding, or other activities.   Thinning, dryness, or irritation of the vagina due to low estrogen levels (vulvovaginal atrophy).  What increases the risk?  This condition is more likely to occur in women who:   Give birth vaginally.   Are sexually active.   Have gone through menopause.   Have low estrogen levels due to certain medicines, breast cancer treatments, or breastfeeding.  What are the signs or symptoms?  Symptoms of this condition include:   Slight to heavy vaginal bleeding.   Vaginal swelling.   Mild to severe pain.   Vaginal tenderness.   Painful urination.   Pain or discomfort during sex.  How is this diagnosed?  If the tear happened during childbirth, your health care provider can diagnose the tear at that time. Your health care provider can diagnose other tears with a medical history and physical exam. Other tests may be done, including:   Blood tests to check your hormone levels and blood loss.   Imaging tests, such as an ultrasonogram or CT scan, to rule out other health issues, such as enlarged lymph nodes or tumors.  How is this treated?  Treatment depends on the severity of the tear. Minor tears may heal on their own. Other treatment may include:   Stitches (sutures).   Medicines, such as:  ? Creams to reduce pain.  ? Vaginal lubricants to treat vaginal dryness.  ? Topical or oral hormonal therapy.  ? Antibiotics. These may be taken orally or given as ointments to prevent or treat infection.  Surgery may be needed if the tear is severe.  Follow these instructions at home:  Wound care   Follow instructions from your health care  provider about how to take care of your tear. Make sure you:  ? Keep the area clean.  ? Leave sutures in place, if this applies. These skin closures may need to stay in place for 2 weeks or longer.   If directed, apply ice to the injured area:  ? Put ice in a plastic bag.  ? Place a towel between your skin and the bag.  ? Leave the ice on for 20 minutes, 2-3 times per day.   Check your wound every day for signs of infection. Check for:  ? More redness, swelling, or pain.  ? More fluid or blood.  ? Warmth.  ? Pus or a bad smell.  Medicines   Take or apply over-the-counter and prescription medicines only as told by your health care provider.   If you were prescribed an antibiotic, take or use it only as told by your health care provider. Do not stop taking or using the antibiotic even if you start to feel better.  General instructions   Take a sitz bath 2-3 times a day or as told by your health care provider. A sitz bath is a shallow, warm water bath that is taken while you are sitting down. The water should only come up to your hips and should cover your buttocks.   Avoid sitting or standing for long periods of time.     is important. Contact a health care provider if:  You have more redness, swelling, or pain in the vaginal area.  You have more fluid or blood coming from your vaginal tear.  You have pus or a bad smell coming from your vaginal area.  You have a fever.  Your tear breaks open after it healed or was repaired.  You continue to have pain during sex after the tear heals.  You have a burning pain when you urinate.  You are urinating more often than usual or feel an increased urgency  to urinate. Get help right away if:  You feel lightheaded.  You have nausea or vomiting.  You have severe pain around your vagina, or in your pelvis or lower belly.  You have heavy vaginal bleeding or you are soaking more than 1 pad per hour. This information is not intended to replace advice given to you by your health care provider. Make sure you discuss any questions you have with your health care provider. Document Released: 01/22/2005 Document Revised: 01/05/2015 Document Reviewed: 11/17/2014 Elsevier Interactive Patient Education  2019 [employer]  AMBULATORY SURGERY  DISCHARGE INSTRUCTIONS   1) The drugs that you were given will stay in your system until tomorrow so for the next 24 hours you should not:  A) Drive an automobile B) Make any legal decisions C) Drink any alcoholic beverage   2) You may resume regular meals tomorrow.  Today it is better to start with liquids and gradually work up to solid foods.  You may eat anything you prefer, but it is better to start with liquids, then soup and crackers, and gradually work up to solid foods.   3) Please notify your doctor immediately if you have any unusual bleeding, trouble breathing, redness and pain at the surgery site, drainage, fever, or pain not relieved by medication.    4) Additional Instructions:        Please contact your physician with any problems or Same Day Surgery at [phone], Monday through Friday 6 am to 4 pm, or [place] at [facility] number at [phone].

## 2018-05-08 ENCOUNTER — Encounter: Payer: Self-pay | Admitting: Obstetrics and Gynecology

## 2018-05-08 LAB — TYPE AND SCREEN
ABO/RH(D): A NEG
Antibody Screen: NEGATIVE

## 2018-07-01 ENCOUNTER — Emergency Department: Payer: Commercial Managed Care - PPO

## 2018-07-01 ENCOUNTER — Other Ambulatory Visit: Payer: Self-pay

## 2018-07-01 ENCOUNTER — Emergency Department
Admission: EM | Admit: 2018-07-01 | Discharge: 2018-07-01 | Disposition: A | Discharge status: Home / Self Care | Payer: Commercial Managed Care - PPO | Attending: Emergency Medicine | Admitting: Emergency Medicine

## 2018-07-01 DIAGNOSIS — F17210 Nicotine dependence, cigarettes, uncomplicated: Secondary | ICD-10-CM | POA: Insufficient documentation

## 2018-07-01 DIAGNOSIS — Z79899 Other long term (current) drug therapy: Secondary | ICD-10-CM | POA: Insufficient documentation

## 2018-07-01 DIAGNOSIS — D6851 Activated protein C resistance: Secondary | ICD-10-CM | POA: Insufficient documentation

## 2018-07-01 DIAGNOSIS — D509 Iron deficiency anemia, unspecified: Secondary | ICD-10-CM | POA: Diagnosis not present

## 2018-07-01 DIAGNOSIS — R079 Chest pain, unspecified: Secondary | ICD-10-CM | POA: Insufficient documentation

## 2018-07-01 LAB — BASIC METABOLIC PANEL
Anion gap: 7 (ref 5–15)
BUN: 13 mg/dL (ref 6–20)
CO2: 24 mmol/L (ref 22–32)
Calcium: 9 mg/dL (ref 8.9–10.3)
Chloride: 106 mmol/L (ref 98–111)
Creatinine, Ser: 0.67 mg/dL (ref 0.44–1.00)
GFR calc Af Amer: >60 mL/min (ref >60)
GFR calc non Af Amer: >60 mL/min (ref >60)
Glucose, Bld: 103 mg/dL — ABNORMAL HIGH (ref 70–99)
Potassium: 3.9 mmol/L (ref 3.5–5.1)
Sodium: 137 mmol/L (ref 135–145)

## 2018-07-01 LAB — CBC
HCT: 36.1 % (ref 36.0–46.0)
Hemoglobin: 10.8 g/dL — ABNORMAL LOW (ref 12.0–15.0)
MCH: 24.4 pg — ABNORMAL LOW (ref 26.0–34.0)
MCHC: 29.9 g/dL — ABNORMAL LOW (ref 30.0–36.0)
MCV: 81.7 fL (ref 80.0–100.0)
Platelets: 279 10*3/uL (ref 150–400)
RBC: 4.42 MIL/uL (ref 3.87–5.11)
RDW: 17.2 % — ABNORMAL HIGH (ref 11.5–15.5)
WBC: 8.2 10*3/uL (ref 4.0–10.5)
nRBC: 0 % (ref 0.0–0.2)

## 2018-07-01 LAB — TROPONIN I
Troponin I: <0.03 ng/mL (ref <0.03)
Troponin I: <0.03 ng/mL (ref <0.03)

## 2018-07-01 LAB — POCT PREGNANCY, URINE: Preg Test, Ur: NEGATIVE

## 2018-07-01 LAB — TSH: TSH: 0.806 u[IU]/mL (ref 0.350–4.500)

## 2018-07-01 MED ORDER — SODIUM CHLORIDE 0.9% FLUSH: 3.0000 mL | Freq: Once | INTRAVENOUS | Status: DC

## 2018-07-01 MED ORDER — IOHEXOL 350 MG/ML SOLN
75.0000 mL | Freq: Once | INTRAVENOUS | Status: AC | PRN
  Administered 2018-07-01: 17:00:00 75 mL via INTRAVENOUS
  Filled 2018-07-01: qty 75

## 2018-07-01 NOTE — ED Triage Notes (Signed)
Pt states she has a hx of tachycardia and takes metoprolol daily for it. States today around 1030am she began having chest pain with diaphoresis, states her heart rate is normally around 115-120 and today during the pain her heart rate dropped to the 70.pt is in NAD at present.
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
## 2018-09-09 ENCOUNTER — Other Ambulatory Visit: Payer: Self-pay

## 2018-09-09 DIAGNOSIS — Z20822 Contact with and (suspected) exposure to covid-19: Secondary | ICD-10-CM

## 2018-09-10 LAB — NOVEL CORONAVIRUS, NAA: SARS-CoV-2, NAA: NOT DETECTED

## 2018-10-15 ENCOUNTER — Other Ambulatory Visit: Payer: Self-pay

## 2018-10-15 ENCOUNTER — Ambulatory Visit (INDEPENDENT_AMBULATORY_CARE_PROVIDER_SITE_OTHER): Payer: Commercial Managed Care - PPO

## 2018-10-15 VITALS — BP 135/88 | HR 109 | Wt 127.0 lb

## 2018-10-15 DIAGNOSIS — R300 Dysuria: Secondary | ICD-10-CM | POA: Diagnosis not present

## 2018-10-15 DIAGNOSIS — B9689 Other specified bacterial agents as the cause of diseases classified elsewhere: Secondary | ICD-10-CM

## 2018-10-15 LAB — POCT URINALYSIS DIPSTICK
Bilirubin, UA: NEGATIVE
Glucose, UA: NEGATIVE
Ketones, UA: NEGATIVE
Leukocytes, UA: NEGATIVE
Nitrite, UA: NEGATIVE
Protein, UA: NEGATIVE
Spec Grav, UA: 1.01 (ref 1.010–1.025)
Urobilinogen, UA: 0.2 E.U./dL
pH, UA: 5 (ref 5.0–8.0)

## 2018-10-15 MED ORDER — METRONIDAZOLE 0.75 % VA GEL: 1.0000 | Freq: Every day | VAGINAL | 1 refills | Status: AC

## 2018-10-15 NOTE — Progress Notes (Signed)
Pt c/o dysuria for 3 days. UA normal. Urine culture sent. Pt is aware we will call with results and treat if necessary.

## 2018-10-15 NOTE — Progress Notes (Signed)
Rx for Metrogel sent to pharmacy. Pt was seen in 2018 for recurrent BV. She is requesting refill of Metrogel. Per Crosby Oyster, RN it is okay to send in one refill but, pt needs to schedule an annual.

## 2018-10-17 LAB — URINE CULTURE

## 2018-10-17 MED ORDER — NITROFURANTOIN MONOHYD MACRO 100 MG PO CAPS: 100.0000 mg | ORAL_CAPSULE | Freq: Two times a day (BID) | ORAL | 0 refills | Status: AC

## 2018-10-17 NOTE — Addendum Note (Signed)
Addended by: Caryl Bis on: 10/17/2018 02:26 PM   Modules accepted: Orders

## 2018-10-17 NOTE — Progress Notes (Signed)
Attestation of Attending Supervision of clinical support staff: I agree with the care provided to this patient and was available for any consultation.  I have reviewed the CMA's note and chart, and I agree with the management and plan. I reviewed the result of the urine culture and sent in abx.   Caren Macadam, MD, MPH, ABFM Attending [place] for [facility]

## 2018-10-22 ENCOUNTER — Ambulatory Visit: Payer: Commercial Managed Care - PPO | Admitting: Advanced Practice Midwife

## 2019-07-28 ENCOUNTER — Encounter (HOSPITAL_COMMUNITY): Payer: Self-pay

## 2019-07-28 ENCOUNTER — Emergency Department (HOSPITAL_COMMUNITY): Payer: Medicaid Other

## 2019-07-28 ENCOUNTER — Other Ambulatory Visit: Payer: Self-pay

## 2019-07-28 ENCOUNTER — Inpatient Hospital Stay (HOSPITAL_COMMUNITY)
Admission: EM | Admit: 2019-07-28 | Discharge: 2019-08-06 | DRG: 922 | Disposition: E | Payer: Medicaid Other | Attending: Critical Care Medicine | Admitting: Critical Care Medicine

## 2019-07-28 DIAGNOSIS — Z91018 Allergy to other foods: Secondary | ICD-10-CM

## 2019-07-28 DIAGNOSIS — Y998 Other external cause status: Secondary | ICD-10-CM

## 2019-07-28 DIAGNOSIS — G936 Cerebral edema: Secondary | ICD-10-CM | POA: Diagnosis present

## 2019-07-28 DIAGNOSIS — G253 Myoclonus: Secondary | ICD-10-CM | POA: Diagnosis present

## 2019-07-28 DIAGNOSIS — Z66 Do not resuscitate: Secondary | ICD-10-CM | POA: Diagnosis not present

## 2019-07-28 DIAGNOSIS — Z79899 Other long term (current) drug therapy: Secondary | ICD-10-CM

## 2019-07-28 DIAGNOSIS — Y213XXA Drowning and submersion after fall into swimming pool, undetermined intent, initial encounter: Secondary | ICD-10-CM

## 2019-07-28 DIAGNOSIS — Z515 Encounter for palliative care: Secondary | ICD-10-CM | POA: Diagnosis not present

## 2019-07-28 DIAGNOSIS — D682 Hereditary deficiency of other clotting factors: Secondary | ICD-10-CM | POA: Diagnosis present

## 2019-07-28 DIAGNOSIS — F17210 Nicotine dependence, cigarettes, uncomplicated: Secondary | ICD-10-CM | POA: Diagnosis present

## 2019-07-28 DIAGNOSIS — I469 Cardiac arrest, cause unspecified: Secondary | ICD-10-CM

## 2019-07-28 DIAGNOSIS — I468 Cardiac arrest due to other underlying condition: Secondary | ICD-10-CM | POA: Diagnosis present

## 2019-07-28 DIAGNOSIS — Z20822 Contact with and (suspected) exposure to covid-19: Secondary | ICD-10-CM | POA: Diagnosis present

## 2019-07-28 DIAGNOSIS — I69392 Facial weakness following cerebral infarction: Secondary | ICD-10-CM

## 2019-07-28 DIAGNOSIS — Z881 Allergy status to other antibiotic agents status: Secondary | ICD-10-CM

## 2019-07-28 DIAGNOSIS — G931 Anoxic brain damage, not elsewhere classified: Secondary | ICD-10-CM

## 2019-07-28 DIAGNOSIS — Y908 Blood alcohol level of 240 mg/100 ml or more: Secondary | ICD-10-CM | POA: Diagnosis present

## 2019-07-28 DIAGNOSIS — D6851 Activated protein C resistance: Secondary | ICD-10-CM | POA: Diagnosis present

## 2019-07-28 DIAGNOSIS — Z886 Allergy status to analgesic agent status: Secondary | ICD-10-CM

## 2019-07-28 DIAGNOSIS — I69369 Other paralytic syndrome following cerebral infarction affecting unspecified side: Secondary | ICD-10-CM

## 2019-07-28 DIAGNOSIS — Q796 Ehlers-Danlos syndrome, unspecified: Secondary | ICD-10-CM

## 2019-07-28 DIAGNOSIS — F10129 Alcohol abuse with intoxication, unspecified: Secondary | ICD-10-CM | POA: Diagnosis present

## 2019-07-28 DIAGNOSIS — J9600 Acute respiratory failure, unspecified whether with hypoxia or hypercapnia: Secondary | ICD-10-CM

## 2019-07-28 DIAGNOSIS — F419 Anxiety disorder, unspecified: Secondary | ICD-10-CM | POA: Diagnosis present

## 2019-07-28 DIAGNOSIS — N179 Acute kidney failure, unspecified: Secondary | ICD-10-CM | POA: Diagnosis present

## 2019-07-28 DIAGNOSIS — Z880 Allergy status to penicillin: Secondary | ICD-10-CM

## 2019-07-28 DIAGNOSIS — T751XXA Unspecified effects of drowning and nonfatal submersion, initial encounter: Principal | ICD-10-CM | POA: Diagnosis present

## 2019-07-28 DIAGNOSIS — R092 Respiratory arrest: Secondary | ICD-10-CM | POA: Diagnosis present

## 2019-07-28 DIAGNOSIS — Z888 Allergy status to other drugs, medicaments and biological substances status: Secondary | ICD-10-CM

## 2019-07-28 DIAGNOSIS — D6859 Other primary thrombophilia: Secondary | ICD-10-CM | POA: Diagnosis present
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
## 2019-07-29 ENCOUNTER — Other Ambulatory Visit (HOSPITAL_COMMUNITY): Payer: Self-pay

## 2019-07-29 ENCOUNTER — Emergency Department (HOSPITAL_COMMUNITY): Payer: Medicaid Other

## 2019-07-29 DIAGNOSIS — I69392 Facial weakness following cerebral infarction: Secondary | ICD-10-CM | POA: Diagnosis not present

## 2019-07-29 DIAGNOSIS — F419 Anxiety disorder, unspecified: Secondary | ICD-10-CM | POA: Diagnosis present

## 2019-07-29 DIAGNOSIS — Z66 Do not resuscitate: Secondary | ICD-10-CM | POA: Diagnosis not present

## 2019-07-29 DIAGNOSIS — Z515 Encounter for palliative care: Secondary | ICD-10-CM | POA: Diagnosis not present

## 2019-07-29 DIAGNOSIS — Y908 Blood alcohol level of 240 mg/100 ml or more: Secondary | ICD-10-CM | POA: Diagnosis present

## 2019-07-29 DIAGNOSIS — D682 Hereditary deficiency of other clotting factors: Secondary | ICD-10-CM | POA: Diagnosis present

## 2019-07-29 DIAGNOSIS — I469 Cardiac arrest, cause unspecified: Secondary | ICD-10-CM

## 2019-07-29 DIAGNOSIS — I69369 Other paralytic syndrome following cerebral infarction affecting unspecified side: Secondary | ICD-10-CM | POA: Diagnosis not present

## 2019-07-29 DIAGNOSIS — Z880 Allergy status to penicillin: Secondary | ICD-10-CM | POA: Diagnosis not present

## 2019-07-29 DIAGNOSIS — Y998 Other external cause status: Secondary | ICD-10-CM | POA: Diagnosis not present

## 2019-07-29 DIAGNOSIS — Z886 Allergy status to analgesic agent status: Secondary | ICD-10-CM | POA: Diagnosis not present

## 2019-07-29 DIAGNOSIS — Z20822 Contact with and (suspected) exposure to covid-19: Secondary | ICD-10-CM | POA: Diagnosis present

## 2019-07-29 DIAGNOSIS — J9600 Acute respiratory failure, unspecified whether with hypoxia or hypercapnia: Secondary | ICD-10-CM | POA: Diagnosis present

## 2019-07-29 DIAGNOSIS — G931 Anoxic brain damage, not elsewhere classified: Secondary | ICD-10-CM | POA: Diagnosis present

## 2019-07-29 DIAGNOSIS — R092 Respiratory arrest: Secondary | ICD-10-CM | POA: Diagnosis present

## 2019-07-29 DIAGNOSIS — D6859 Other primary thrombophilia: Secondary | ICD-10-CM | POA: Diagnosis present

## 2019-07-29 DIAGNOSIS — G936 Cerebral edema: Secondary | ICD-10-CM | POA: Diagnosis present

## 2019-07-29 DIAGNOSIS — F17210 Nicotine dependence, cigarettes, uncomplicated: Secondary | ICD-10-CM | POA: Diagnosis present

## 2019-07-29 DIAGNOSIS — D6851 Activated protein C resistance: Secondary | ICD-10-CM | POA: Diagnosis present

## 2019-07-29 DIAGNOSIS — Z91018 Allergy to other foods: Secondary | ICD-10-CM | POA: Diagnosis not present

## 2019-07-29 DIAGNOSIS — G253 Myoclonus: Secondary | ICD-10-CM | POA: Diagnosis present

## 2019-07-29 DIAGNOSIS — T751XXA Unspecified effects of drowning and nonfatal submersion, initial encounter: Secondary | ICD-10-CM | POA: Diagnosis present

## 2019-07-29 DIAGNOSIS — I468 Cardiac arrest due to other underlying condition: Secondary | ICD-10-CM | POA: Diagnosis present

## 2019-07-29 DIAGNOSIS — Z881 Allergy status to other antibiotic agents status: Secondary | ICD-10-CM | POA: Diagnosis not present

## 2019-07-29 DIAGNOSIS — Z888 Allergy status to other drugs, medicaments and biological substances status: Secondary | ICD-10-CM | POA: Diagnosis not present

## 2019-07-29 DIAGNOSIS — F10129 Alcohol abuse with intoxication, unspecified: Secondary | ICD-10-CM | POA: Diagnosis present

## 2019-07-29 DIAGNOSIS — Q796 Ehlers-Danlos syndrome, unspecified: Secondary | ICD-10-CM | POA: Diagnosis not present

## 2019-07-29 DIAGNOSIS — N179 Acute kidney failure, unspecified: Secondary | ICD-10-CM | POA: Diagnosis present

## 2019-07-29 LAB — I-STAT ARTERIAL BLOOD GAS, ED
Acid-base deficit: 16 mmol/L — ABNORMAL HIGH (ref 0.0–2.0)
Acid-base deficit: 15 mmol/L — ABNORMAL HIGH (ref 0.0–2.0)
Bicarbonate: 17.2 mmol/L — ABNORMAL LOW (ref 20.0–28.0)
Bicarbonate: 18.7 mmol/L — ABNORMAL LOW (ref 20.0–28.0)
Calcium, Ion: 1.15 mmol/L (ref 1.15–1.40)
Calcium, Ion: 1.16 mmol/L (ref 1.15–1.40)
HCT: 50 % — ABNORMAL HIGH (ref 36.0–46.0)
HCT: 50 % — ABNORMAL HIGH (ref 36.0–46.0)
Hemoglobin: 17 g/dL — ABNORMAL HIGH (ref 12.0–15.0)
Hemoglobin: 17 g/dL — ABNORMAL HIGH (ref 12.0–15.0)
O2 Saturation: 94 %
O2 Saturation: 85 %
Patient temperature: 95.5
Patient temperature: 98.3
Potassium: 3.8 mmol/L (ref 3.5–5.1)
Potassium: 3.5 mmol/L (ref 3.5–5.1)
Sodium: 134 mmol/L — ABNORMAL LOW (ref 135–145)
Sodium: 137 mmol/L (ref 135–145)
TCO2: 19 mmol/L — ABNORMAL LOW (ref 22–32)
TCO2: 21 mmol/L — ABNORMAL LOW (ref 22–32)
pCO2 arterial: 69.3 mmHg (ref 32.0–48.0)
pCO2 arterial: 79.2 mmHg (ref 32.0–48.0)
pH, Arterial: 6.992 — CL (ref 7.350–7.450)
pH, Arterial: 6.98 — CL (ref 7.350–7.450)
pO2, Arterial: 101 mmHg (ref 83.0–108.0)
pO2, Arterial: 77 mmHg — ABNORMAL LOW (ref 83.0–108.0)

## 2019-07-29 LAB — CBC WITH DIFFERENTIAL/PLATELET
Abs Immature Granulocytes: 1.39 10*3/uL — ABNORMAL HIGH (ref 0.00–0.07)
Basophils Absolute: 0.2 10*3/uL — ABNORMAL HIGH (ref 0.0–0.1)
Basophils Relative: 2 %
Eosinophils Absolute: 0.2 10*3/uL (ref 0.0–0.5)
Eosinophils Relative: 1 %
HCT: 42.7 % (ref 36.0–46.0)
Hemoglobin: 13.4 g/dL (ref 12.0–15.0)
Immature Granulocytes: 10 %
Lymphocytes Relative: 31 %
Lymphs Abs: 4.6 10*3/uL — ABNORMAL HIGH (ref 0.7–4.0)
MCH: 32.4 pg (ref 26.0–34.0)
MCHC: 31.4 g/dL (ref 30.0–36.0)
MCV: 103.1 fL — ABNORMAL HIGH (ref 80.0–100.0)
Monocytes Absolute: 0.4 10*3/uL (ref 0.1–1.0)
Monocytes Relative: 3 %
Neutro Abs: 7.9 10*3/uL — ABNORMAL HIGH (ref 1.7–7.7)
Neutrophils Relative %: 53 %
Platelets: 54 10*3/uL — ABNORMAL LOW (ref 150–400)
RBC: 4.14 MIL/uL (ref 3.87–5.11)
RDW: 13.2 % (ref 11.5–15.5)
WBC: 14.7 10*3/uL — ABNORMAL HIGH (ref 4.0–10.5)
nRBC: 1.2 % — ABNORMAL HIGH (ref 0.0–0.2)

## 2019-07-29 LAB — MAGNESIUM: Magnesium: 2.4 mg/dL (ref 1.7–2.4)

## 2019-07-29 LAB — BASIC METABOLIC PANEL
Anion gap: 21 — ABNORMAL HIGH (ref 5–15)
BUN: 10 mg/dL (ref 6–20)
CO2: 14 mmol/L — ABNORMAL LOW (ref 22–32)
Calcium: 7.9 mg/dL — ABNORMAL LOW (ref 8.9–10.3)
Chloride: 100 mmol/L (ref 98–111)
Creatinine, Ser: 1.4 mg/dL — ABNORMAL HIGH (ref 0.44–1.00)
GFR calc Af Amer: 56 mL/min — ABNORMAL LOW (ref >60)
GFR calc non Af Amer: 49 mL/min — ABNORMAL LOW (ref >60)
Glucose, Bld: 168 mg/dL — ABNORMAL HIGH (ref 70–99)
Potassium: 5.6 mmol/L — ABNORMAL HIGH (ref 3.5–5.1)
Sodium: 135 mmol/L (ref 135–145)

## 2019-07-29 LAB — ACETAMINOPHEN LEVEL: Acetaminophen (Tylenol), Serum: <10 ug/mL — ABNORMAL LOW (ref 10–30)

## 2019-07-29 LAB — SARS CORONAVIRUS 2 BY RT PCR (HOSPITAL ORDER, PERFORMED IN ~~LOC~~ HOSPITAL LAB): SARS Coronavirus 2: NEGATIVE

## 2019-07-29 LAB — ETHANOL: Alcohol, Ethyl (B): 295 mg/dL — ABNORMAL HIGH (ref <10)

## 2019-07-29 LAB — MRSA PCR SCREENING: MRSA by PCR: NEGATIVE

## 2019-07-29 LAB — SALICYLATE LEVEL: Salicylate Lvl: <7 mg/dL — ABNORMAL LOW (ref 7.0–30.0)

## 2019-07-29 LAB — PHOSPHORUS: Phosphorus: 11.4 mg/dL — ABNORMAL HIGH (ref 2.5–4.6)

## 2019-07-29 LAB — LACTIC ACID, PLASMA: Lactic Acid, Venous: >11 mmol/L (ref 0.5–1.9)

## 2019-07-29 MED ORDER — METRONIDAZOLE IN NACL 5-0.79 MG/ML-% IV SOLN
500.0000 mg | Freq: Three times a day (TID) | INTRAVENOUS | Status: DC
  Filled 2019-07-29 (×2): qty 100

## 2019-07-29 MED ORDER — LEVOFLOXACIN IN D5W 500 MG/100ML IV SOLN
500.0000 mg | Freq: Every day | INTRAVENOUS | Status: DC
  Administered 2019-07-29: 500 mg via INTRAVENOUS
  Filled 2019-07-29: qty 100

## 2019-07-29 MED ORDER — LEVETIRACETAM IN NACL 1000 MG/100ML IV SOLN
1000.0000 mg | Freq: Once | INTRAVENOUS | Status: AC
  Administered 2019-07-29: 1000 mg via INTRAVENOUS
  Filled 2019-07-29: qty 100

## 2019-07-29 MED ORDER — SODIUM CHLORIDE 0.9 % IV SOLN: INTRAVENOUS | Status: DC

## 2019-07-29 MED ORDER — MIDAZOLAM HCL 2 MG/2ML IJ SOLN: 2.0000 mg | INTRAMUSCULAR | Status: DC | PRN

## 2019-07-29 MED ORDER — FAMOTIDINE IN NACL 20-0.9 MG/50ML-% IV SOLN
20.0000 mg | Freq: Two times a day (BID) | INTRAVENOUS | Status: DC
  Filled 2019-07-29: qty 50

## 2019-07-29 MED ORDER — STERILE WATER FOR INJECTION IV SOLN
INTRAVENOUS | Status: DC
  Filled 2019-07-29: qty 850

## 2019-07-29 MED ORDER — HYDROMORPHONE HCL 1 MG/ML IJ SOLN
2.0000 mg | Freq: Two times a day (BID) | INTRAMUSCULAR | Status: DC | PRN
  Filled 2019-07-29: qty 2

## 2019-07-29 MED ORDER — HYDROMORPHONE HCL 1 MG/ML IJ SOLN: 2.0000 mg | INTRAMUSCULAR | Status: DC | PRN

## 2019-07-29 MED ORDER — NOREPINEPHRINE 4 MG/250ML-% IV SOLN
0.0000 ug/min | INTRAVENOUS | Status: DC
  Filled 2019-07-29: qty 250

## 2019-07-29 MED ORDER — LORAZEPAM 2 MG/ML IJ SOLN
2.0000 mg | Freq: Once | INTRAMUSCULAR | Status: AC
  Administered 2019-07-29: 2 mg via INTRAVENOUS
  Filled 2019-07-29: qty 1

## 2019-07-29 MED ORDER — MIDAZOLAM HCL 2 MG/2ML IJ SOLN
2.0000 mg | Freq: Once | INTRAMUSCULAR | Status: AC
  Administered 2019-07-29: 2 mg via INTRAVENOUS
  Filled 2019-07-29: qty 2

## 2019-07-29 MED ORDER — SODIUM BICARBONATE 8.4 % IV SOLN
100.0000 meq | Freq: Once | INTRAVENOUS | Status: AC
  Administered 2019-07-29: 100 meq via INTRAVENOUS
  Filled 2019-07-29: qty 100

## 2019-07-29 MED ORDER — MIDAZOLAM HCL 2 MG/2ML IJ SOLN
2.0000 mg | INTRAMUSCULAR | Status: DC | PRN
  Administered 2019-07-29: 2 mg via INTRAVENOUS
  Filled 2019-07-29: qty 2

## 2019-07-29 MED ORDER — HEPARIN SODIUM (PORCINE) 5000 UNIT/ML IJ SOLN
5000.0000 [IU] | Freq: Three times a day (TID) | INTRAMUSCULAR | Status: DC
  Administered 2019-07-29: 5000 [IU] via SUBCUTANEOUS
  Filled 2019-07-29: qty 1

## 2019-08-06 DIAGNOSIS — 419620001 Death: Secondary | SNOMED CT | POA: Diagnosis present

## 2019-08-06 NOTE — Progress Notes (Signed)
Family made RN aware of wish to withdraw care as soon as possible. ELink MD notified, ground team to come to bedside.

## 2019-08-06 NOTE — Progress Notes (Signed)
Teen daughter very upset-wanting to say she is sorry for argument.  Chaplain helped coordinate family and doctor visitation and Consult A.     07/15/2019 0200  Clinical Encounter Type  Visited With Family;Patient and family together;Health care provider  Visit Type Other (Comment) (coordinate care)  Referral From Family;Nurse;Other (Comment)  Consult/Referral To Chaplain  Stress Factors  Family Stress Factors Family relationships

## 2019-08-06 NOTE — ED Notes (Signed)
Bear hugger applied per MD Horton who states to warm patient until 6 F core temp.

## 2019-08-06 NOTE — ED Notes (Signed)
Family in room at this time. 

## 2019-08-06 NOTE — Progress Notes (Signed)
Patient transported to 2M09 without complications.
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
## 2019-08-06 NOTE — Progress Notes (Signed)
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 Nursing request for Versed.   eICU Interventions  Plan: 1. Versed 2 mg IV X 1 now.      Intervention Category Major Interventions: Other:  Lenell Antu 2019-07-30, 4:41 AM

## 2019-08-06 NOTE — Progress Notes (Signed)
Family now requests withdrawal of care including mechanical ventilation and sedation for comfort.  I will follow their wishes.

## 2019-08-06 NOTE — Progress Notes (Signed)
I had a group discussion with the patient's mother, father and sister and they are in agreement that they do not want to escalate care but to make sure she is comfortable.  To that end we specifically discussed CPR, anti-arrhythmics, and vasopressors.  They want non of these interventions but to continue current care including mechanical ventilation and best attempts at seizure control.

## 2019-08-06 NOTE — ED Notes (Signed)
NP Renae Fickle and MD Horton made aware of elevated lactic , no orders at this time

## 2019-08-06 NOTE — Progress Notes (Signed)
eLink Physician-Brief Progress Note Patient Name: Regina Butler DOB: [birth year] MRN: [id number]   Date of Service  07/10/2019  HPI/Events of Note  ABG on 100%/PRVC 26/TV 400/P 8 = 6.98/79.2/77.  eICU Interventions  Plan: 1. Increase PRVC rate to 35 and PEEP to 10. 2. NaHCO3 100 meq IV now. 3. NaHCO3 IV infusion at 100 mL/hour.  4. Repeat ABG at 5 AM.      Intervention Category Major Interventions: Respiratory failure - evaluation and management  [name] 07/10/2019, 3:54 AM
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
## 2019-08-06 NOTE — Progress Notes (Signed)
Patient transported to CT without complications.

## 2019-08-06 NOTE — ED Notes (Signed)
Pt is attached to cardiac monitor , pulse ox, and bp cuff, pt is intubated at this time, pt noted to be resting, and appears to be in NAD at this time

## 2019-08-06 NOTE — Procedures (Signed)
Extubation Procedure Note  Patient Details:   Name: Regina Butler DOB: [birth year] MRN: [id number]   Airway Documentation:    Vent end date: 2019-08-08 Vent end time: 0558   Pt terminally extubated per MD order.   Berton Bon 2019-08-08, 6:00 AM

## 2019-08-06 NOTE — ED Notes (Signed)
Report given to inpatient RN.

## 2019-08-06 NOTE — Code Documentation (Signed)
Pt transported to CT ?

## 2019-08-06 NOTE — Progress Notes (Signed)
eLink Physician-Brief Progress Note Patient Name: Regina Butler DOB: 06-Apr-1985 MRN: [id number]   Date of Service  2019-08-06  HPI/Events of Note  Anoxic myoclonus   eICU Interventions  Plan: 1. Versed 2 mg IV Q 2 hours PRN anoxic myoclonus.     Intervention Category Major Interventions: Other:  Lenell Antu 2019/08/06, 5:21 AM
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
## 2019-08-06 NOTE — Progress Notes (Addendum)
Called CDS per policy. Referral number [id number].   CDS to follow along. Per CDS staff is to call back if there is to be a family meeting or the decision is made to withdrawal care.

## 2019-08-06 NOTE — ED Notes (Signed)
Report given to RN Janet

## 2019-08-06 DEATH — deceased
